# Patient Record
Sex: Female | Born: 1995 | Hispanic: Yes | Marital: Married | Smoking: Never smoker
Health system: Southern US, Community
[De-identification: ages and names within clinical notes are randomized; demographics above are authoritative.]

## PROBLEM LIST (undated history)

## (undated) DIAGNOSIS — Z789 Other specified health status: Secondary | ICD-10-CM

## (undated) HISTORY — PX: NO PAST SURGERIES: SHX2092

---
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
## 2013-04-05 NOTE — L&D Delivery Note (Signed)
`````  Attestation of Attending Supervision of Advanced Practitioner: Evaluation and management procedures were performed by the PA/NP/CNM/OB Fellow under my supervision/collaboration. Chart reviewed and agree with management and plan.  Paizlee Kinder V 12/28/2013 8:30 AM

## 2013-04-05 NOTE — L&D Delivery Note (Signed)
Delivery Note At 5:14 AM a viable female was delivered via Vaginal, Spontaneous Delivery (Presentation: ROA  ).  APGAR: 9, 9; weight 7 lb 7.8 oz (3395 g) Placenta status: intact, 3 vessel Cord:  with the following complications: .  Cord pH: n/a  Anesthesia:  none Episiotomy: n/a Lacerations: 1st degree hemostatic periurethral Suture Repair: none required Est. Blood Loss ( ): received cytotec  Mom to postpartum.  Baby to Couplet care / Skin to Skin.  Sandy Morton ROCIO 12/13/2013, 5:30 AM

## 2013-06-11 ENCOUNTER — Ambulatory Visit (INDEPENDENT_AMBULATORY_CARE_PROVIDER_SITE_OTHER): Payer: Medicaid Other | Admitting: Adult Health

## 2013-06-11 ENCOUNTER — Encounter: Payer: Self-pay | Admitting: Adult Health

## 2013-06-11 VITALS — BP 140/80 | Ht 61.0 in | Wt 156.0 lb

## 2013-06-11 DIAGNOSIS — Z3201 Encounter for pregnancy test, result positive: Secondary | ICD-10-CM

## 2013-06-11 LAB — POCT URINE PREGNANCY: Preg Test, Ur: POSITIVE

## 2013-06-11 NOTE — Progress Notes (Signed)
Patient ID: Sandy Morton, female   DOB: 09/26/1995, 18 y.o.   MRN: [id number] Pt here for pregnancy test, resulted positive. PNV given. Pt to return in 1-2 weeks for New OB.

## 2013-06-15 ENCOUNTER — Ambulatory Visit: Payer: Medicaid Other | Admitting: General Practice

## 2013-06-15 ENCOUNTER — Ambulatory Visit (INDEPENDENT_AMBULATORY_CARE_PROVIDER_SITE_OTHER): Payer: Medicaid Other | Admitting: General Practice

## 2013-06-15 ENCOUNTER — Encounter: Payer: Self-pay | Admitting: General Practice

## 2013-06-15 VITALS — BP 106/63 | HR 78 | Temp 99.8°F | Ht 59.75 in | Wt 158.0 lb

## 2013-06-15 DIAGNOSIS — L83 Acanthosis nigricans: Secondary | ICD-10-CM

## 2013-06-15 DIAGNOSIS — L089 Local infection of the skin and subcutaneous tissue, unspecified: Secondary | ICD-10-CM

## 2013-06-15 DIAGNOSIS — L259 Unspecified contact dermatitis, unspecified cause: Secondary | ICD-10-CM

## 2013-06-15 LAB — POCT GLYCOSYLATED HEMOGLOBIN (HGB A1C)

## 2013-06-15 MED ORDER — MUPIROCIN CALCIUM 2 % EX CREA: 1.0000 "application " | TOPICAL_CREAM | Freq: Three times a day (TID) | CUTANEOUS | Status: AC

## 2013-06-15 MED ORDER — TRIAMCINOLONE ACETONIDE 0.025 % EX CREA: 1.0000 "application " | TOPICAL_CREAM | Freq: Two times a day (BID) | CUTANEOUS | Status: DC

## 2013-06-15 NOTE — Patient Instructions (Signed)
Hand Washing Staying healthy is important to you and your entire family. Follow these easy, low-cost steps to help stop many infectious diseases before they happen. HOW TO [facility]  Wet your hands and apply liquid, bar, or powder soap.  Rub hands together vigorously to make a lather and scrub all surfaces. Be sure to clean between the fingers and around the nails.  Continue for 20 seconds! It takes that long for the soap and scrubbing action to dislodge and remove stubborn germs.  Rinse hands well under running water.  Dry your hands using a paper towel or air dryer.  If possible, use your paper towel or elbow to turn off the faucet. This will help avoid re-exposure to germs on the handle. WHEN TO [facility] YOUR HANDS  Before and after eating.  Before, during, and after handling or preparing food.  After contact with blood or body fluids (like vomit, nasal secretions, or saliva). This means washing after you blow your nose!  Before and after changing a diaper.  After you use the bathroom.  After handling animals, their toys, leashes, or waste.  After touching something that could be contaminated (such as a trash can, cleaning cloth, drain, or soil).  Before and after taking care of (dressing) a wound, giving medicine, or inserting contact lenses.  More often when someone in your home is sick.  Whenever your hands become soiled. If soap and water are not available, use an alcohol-based wipe or hand gel. Keeping your hands clean is one of the best ways to keep from getting sick and spreading illnesses. Cleaning your hands gets rid of germs you pick up:  From other people.  From the surfaces you touch.  From the animals you come in contact with. Document Released: 11/10/2004 Document Revised: 07/17/2012 Document Reviewed: 04/17/2008 [facility] Patient Information 2014 [place], [place].  Lavado de manos Careers information officer) Mantener la salud es importante tanto para usted como para toda su  familia. Siga estos simples y econmicos consejos para ayudar a prevenir muchas enfermedades infecciosas. CMO LAVARSE  Moje sus manos y aplique jabn lquido, en barra o en polvo.  Frtese las [employer] s vigorosamente para que se forme espuma, y refriegue todas las superficies. Asegrese de [employer] dedos y alrededor de las uas.  Contine durante 20 segundos! Se necesita esa cantidad de tiempo para que el jabn y la accin de frotacin logren desprender y remover algunos grmenes "testarudos".  Enjuguese bien las manos bajo el agua.  Squelas utilizando toallas de papel o Neomia Dear secadora de [place].  De ser posible, utilice la toalla de papel para cerrar el grifo. CUNDO LAVARSE LAS MANOS  Antes de comer.  Antes, durante, y luego de Community education officer o preparar alimentos.  Luego de entrar en contacto con sangre o fludos corporales (como vmito, secreciones nasales, o saliva). Esto significa que debe lavarse despus de limpiarse la nariz.  Luego de cambiar paales.  Luego de [employer].  Luego de tocar animales o sus juguetes, correa o deshechos.  Luego de tocar algo que pudo estar contaminado (como el bote de basura, pao de limpieza, el desage o [place]).  Antes de colocar un vendaje, dar medicamentos o colocar lentes de contacto.  Hgalo ms a menudo cuando haya alguien enfermo en su casa.  Siempre que sus manos se vean sucias. Si no tiene [place] y [place], use un limpiador de manos con alcohol. Mantener las manos limpias es una de las mejores formas de Automotive engineer enfermarse y Engineering geologist  enfermedades. Limpiando sus manos eliminar los grmenes que obtuvo:  FirefighterDe otras personas.  De las superficies que toca.  De los VF Corporationanimales con los que TEFL teacherentra en contacto. Document Released: 09/08/2007 Document Revised: 06/14/2011 [facility] Patient Information 2014 [place], [place].

## 2013-06-15 NOTE — Progress Notes (Signed)
   Subjective:    Patient ID: Sandy Morton, female    DOB: 03/17/1996, 18 y.o.   MRN: [id number]  HPI Patient presents today with complaints of red bump to left forearm. Reports similar symptoms in the past, which have healed. Denies itching, but tender at times. She is pregnant, but unsure of gestational age. [facility] and has an appointment in two weeks.  Denies any other problems.     Review of Systems  Constitutional: Negative for fever and chills.  Respiratory: Negative for chest tightness and shortness of breath.   Cardiovascular: Negative for chest pain and palpitations.  Skin:       Red area to left forearm       Objective:   Physical Exam  Constitutional: She is oriented to person, place, and time. She appears well-developed and well-nourished.  Cardiovascular: Normal rate, regular rhythm and normal heart sounds.   Pulmonary/Chest: Effort normal and breath sounds normal. No respiratory distress. She exhibits no tenderness.  Neurological: She is alert and oriented to person, place, and time.  Skin: Skin is warm and dry. There is erythema.  Raised, firm, erythematous area to left forearm.   Maculopapular rash near bilateral underarm  Psychiatric: She has a normal mood and affect.          Assessment & Plan:  1. AN (acanthosis nigricans)  - POCT glycosylated hemoglobin (Hb A1C)  2. Skin infection  - Aerobic culture - mupirocin cream (BACTROBAN) 2 %; Apply 1 application topically 3 (three) times daily. Apply to affected area  Dispense: 15 g; Refill: 1  3. Contact dermatitis  - triamcinolone (KENALOG) 0.025 % cream; Apply 1 application topically 2 (two) times daily. Apply to rash close to under arms  Dispense: 30 g; Refill: 0 -keep skin clean and dry -avoid scratching -RTO prn -will refer to dermatology if symptoms persist Patient verbalized understanding Coralie KeensMae E. Darria Corvera, FNP-C

## 2013-06-17 LAB — AEROBIC CULTURE

## 2013-06-18 ENCOUNTER — Other Ambulatory Visit: Payer: Self-pay | Admitting: Obstetrics & Gynecology

## 2013-06-18 DIAGNOSIS — O3680X0 Pregnancy with inconclusive fetal viability, not applicable or unspecified: Secondary | ICD-10-CM

## 2013-06-20 ENCOUNTER — Other Ambulatory Visit: Payer: Medicaid Other

## 2013-06-23 ENCOUNTER — Other Ambulatory Visit: Payer: Self-pay | Admitting: General Practice

## 2013-06-23 DIAGNOSIS — L089 Local infection of the skin and subcutaneous tissue, unspecified: Secondary | ICD-10-CM

## 2013-06-23 DIAGNOSIS — B958 Unspecified staphylococcus as the cause of diseases classified elsewhere: Secondary | ICD-10-CM

## 2013-06-23 DIAGNOSIS — R21 Rash and other nonspecific skin eruption: Secondary | ICD-10-CM

## 2013-07-02 ENCOUNTER — Other Ambulatory Visit: Payer: Self-pay | Admitting: Obstetrics & Gynecology

## 2013-07-02 ENCOUNTER — Ambulatory Visit (INDEPENDENT_AMBULATORY_CARE_PROVIDER_SITE_OTHER): Payer: Medicaid Other

## 2013-07-02 DIAGNOSIS — Z1389 Encounter for screening for other disorder: Secondary | ICD-10-CM

## 2013-07-02 DIAGNOSIS — O0930 Supervision of pregnancy with insufficient antenatal care, unspecified trimester: Secondary | ICD-10-CM

## 2013-07-02 DIAGNOSIS — Z363 Encounter for antenatal screening for malformations: Secondary | ICD-10-CM

## 2013-07-02 DIAGNOSIS — O3680X0 Pregnancy with inconclusive fetal viability, not applicable or unspecified: Secondary | ICD-10-CM

## 2013-07-02 NOTE — Progress Notes (Signed)
U/S-single active fetus, meas c/w 16+4wks EDD 12/13/2013, cx appears closed (3.5cm), bilateral adnexa appears WNL, anterior Gr 0 placenta, FHR-157 BPM, anatomy reviewed unable to get transverse spine pics and 3VC, would like to reck all anatomy at ~24-28 wks, female fetus

## 2013-07-04 ENCOUNTER — Encounter: Payer: Self-pay | Admitting: *Deleted

## 2013-07-10 ENCOUNTER — Encounter: Payer: Medicaid Other | Admitting: Women's Health

## 2013-09-04 ENCOUNTER — Encounter: Payer: Self-pay | Admitting: Adult Health

## 2013-09-04 ENCOUNTER — Ambulatory Visit (INDEPENDENT_AMBULATORY_CARE_PROVIDER_SITE_OTHER): Payer: Medicaid Other | Admitting: Adult Health

## 2013-09-04 VITALS — BP 118/64 | Wt 166.0 lb

## 2013-09-04 DIAGNOSIS — Z3490 Encounter for supervision of normal pregnancy, unspecified, unspecified trimester: Secondary | ICD-10-CM | POA: Insufficient documentation

## 2013-09-04 DIAGNOSIS — Z3480 Encounter for supervision of other normal pregnancy, unspecified trimester: Secondary | ICD-10-CM

## 2013-09-04 NOTE — Patient Instructions (Signed)
Third Trimester of Pregnancy The third trimester is from week 29 through week 42, months 7 through 9. The third trimester is a time when the fetus is growing rapidly. At the end of the ninth month, the fetus is about 20 inches in length and weighs 6 10 pounds.  BODY CHANGES Your body goes through many changes during pregnancy. The changes vary from woman to woman.   Your weight will continue to increase. You can expect to gain 25 35 pounds (11 16 kg) by the end of the pregnancy.  You may begin to get stretch marks on your hips, abdomen, and breasts.  You may urinate more often because the fetus is moving lower into your pelvis and pressing on your bladder.  You may develop or continue to have heartburn as a result of your pregnancy.  You may develop constipation because certain hormones are causing the muscles that push waste through your intestines to slow down.  You may develop hemorrhoids or swollen, bulging veins (varicose veins).  You may have pelvic pain because of the weight gain and pregnancy hormones relaxing your joints between the bones in your pelvis. Back aches may result from over exertion of the muscles supporting your posture.  Your breasts will continue to grow and be tender. A yellow discharge may leak from your breasts called colostrum.  Your belly button may stick out.  You may feel short of breath because of your expanding uterus.  You may notice the fetus "dropping," or moving lower in your abdomen.  You may have a bloody mucus discharge. This usually occurs a few days to a week before labor begins.  Your cervix becomes thin and soft (effaced) near your due date. WHAT TO EXPECT AT YOUR PRENATAL EXAMS  You will have prenatal exams every 2 weeks until week 36. Then, you will have weekly prenatal exams. During a routine prenatal visit:  You will be weighed to make sure you and the fetus are growing normally.  Your blood pressure is taken.  Your abdomen will be  measured to track your baby's growth.  The fetal heartbeat will be listened to.  Any test results from the previous visit will be discussed.  You may have a cervical check near your due date to see if you have effaced. At around 36 weeks, your caregiver will check your cervix. At the same time, your caregiver will also perform a test on the secretions of the vaginal tissue. This test is to determine if a type of bacteria, Group B streptococcus, is present. Your caregiver will explain this further. Your caregiver may ask you:  What your birth plan is.  How you are feeling.  If you are feeling the baby move.  If you have had any abnormal symptoms, such as leaking fluid, bleeding, severe headaches, or abdominal cramping.  If you have any questions. Other tests or screenings that may be performed during your third trimester include:  Blood tests that check for low iron levels (anemia).  Fetal testing to check the health, activity level, and growth of the fetus. Testing is done if you have certain medical conditions or if there are problems during the pregnancy. FALSE LABOR You may feel small, irregular contractions that eventually go away. These are called Braxton Hicks contractions, or false labor. Contractions may last for hours, days, or even weeks before true labor sets in. If contractions come at regular intervals, intensify, or become painful, it is best to be seen by your caregiver.  SIGNS OF LABOR   Menstrual-like cramps.  Contractions that are 5 minutes apart or less.  Contractions that start on the top of the uterus and spread down to the lower abdomen and back.  A sense of increased pelvic pressure or back pain.  A watery or bloody mucus discharge that comes from the vagina. If you have any of these signs before the 37th week of pregnancy, call your caregiver right away. You need to go to the hospital to get checked immediately. HOME CARE INSTRUCTIONS   Avoid all  smoking, herbs, alcohol, and unprescribed drugs. These chemicals affect the formation and growth of the baby.  Follow your caregiver's instructions regarding medicine use. There are medicines that are either safe or unsafe to take during pregnancy.  Exercise only as directed by your caregiver. Experiencing uterine cramps is a good sign to stop exercising.  Continue to eat regular, healthy meals.  Wear a good support bra for breast tenderness.  Do not use hot tubs, steam rooms, or saunas.  Wear your seat belt at all times when driving.  Avoid raw meat, uncooked cheese, cat litter boxes, and soil used by cats. These carry germs that can cause birth defects in the baby.  Take your prenatal vitamins.  Try taking a stool softener (if your caregiver approves) if you develop constipation. Eat more high-fiber foods, such as fresh vegetables or fruit and whole grains. Drink plenty of fluids to keep your urine clear or pale yellow.  Take warm sitz baths to soothe any pain or discomfort caused by hemorrhoids. Use hemorrhoid cream if your caregiver approves.  If you develop varicose veins, wear support hose. Elevate your feet for 15 minutes, 3 4 times a day. Limit salt in your diet.  Avoid heavy lifting, wear low heal shoes, and practice good posture.  Rest a lot with your legs elevated if you have leg cramps or low back pain.  Visit your dentist if you have not gone during your pregnancy. Use a soft toothbrush to brush your teeth and be gentle when you floss.  A sexual relationship may be continued unless your caregiver directs you otherwise.  Do not travel far distances unless it is absolutely necessary and only with the approval of your caregiver.  Take prenatal classes to understand, practice, and ask questions about the labor and delivery.  Make a trial run to the hospital.  Pack your hospital bag.  Prepare the baby's nursery.  Continue to go to all your prenatal visits as directed  by your caregiver. SEEK MEDICAL CARE IF:  You are unsure if you are in labor or if your water has broken.  You have dizziness.  You have mild pelvic cramps, pelvic pressure, or nagging pain in your abdominal area.  You have persistent nausea, vomiting, or diarrhea.  You have a bad smelling vaginal discharge.  You have pain with urination. SEEK IMMEDIATE MEDICAL CARE IF:   You have a fever.  You are leaking fluid from your vagina.  You have spotting or bleeding from your vagina.  You have severe abdominal cramping or pain.  You have rapid weight loss or gain.  You have shortness of breath with chest pain.  You notice sudden or extreme swelling of your face, hands, ankles, feet, or legs.  You have not felt your baby move in over an hour.  You have severe headaches that do not go away with medicine.  You have vision changes. Document Released: 03/16/2001 Document Revised: 11/22/2012 Document Reviewed:   05/23/2012 Exit[employer] Patient Information 2014 [place], [place]. Return in 3 weeks for [place] and 2 hour sugar test

## 2013-09-04 NOTE — Progress Notes (Signed)
Subjective:  Sandy Morton is a 18 y.o. G78P1001 Hispanic female at [redacted]w[redacted]d by LMP being seen today for her first obstetrical visit.  Her obstetrical history is significant for pre-eclampsia was induced with prior delivery.  Pregnancy history fully reviewed.  Patient reports no complaints. Denies vb, cramping, uti s/s, abnormal/malodorous vag d/c, or vulvovaginal itching/irritation.  BP 118/64  Wt 166 lb (75.297 kg)  LMP 02/28/2013  HISTORY: OB History  Gravida Para Term Preterm AB SAB TAB Ectopic Multiple Living  2 1 1  0 0 0 0 0 0 1    # Outcome Date GA Lbr Len/2nd Weight Sex Delivery Anes PTL Lv  2 CUR           1 TRM 06/20/11 [redacted]w[redacted]d 06:00 / 02:16 7 lb 4.2 oz (3.294 kg) M SVD EPI  Y     Past Medical History  Diagnosis Date  . No pertinent past medical history   . Pregnant state, incidental   . Supervision of normal pregnancy 09/04/2013   Past Surgical History  Procedure Laterality Date  . No past surgeries     History reviewed. No pertinent family history.  Exam   System:     General: Well developed & nourished, no acute distress   Skin: Warm & dry, normal coloration and turgor, no rashes   Neurologic: Alert & oriented, normal mood   Cardiovascular: Regular rate & rhythm   Respiratory: Effort & rate normal, LCTAB, acyanotic   Abdomen: Soft, non tender   Extremities: normal strength, tone   Pelvic Exam:    Perineum: deferred   Vulva: deferred   Vagina:  deferred   Cervix: deferred   Uterus: deferred    FHR:136  Via doppler   Assessment:   Pregnancy: G2P1001 Patient Active Problem List   Diagnosis Date Noted  . Supervision of normal pregnancy 09/04/2013  . Preeclampsia, severe 06/21/2011    [redacted]w[redacted]d G2P1001 New OB visit     Plan:  Initial labs drawn Continue prenatal vitamins Problem list reviewed and updated Reviewed n/v relief measures and warning s/s to report Reviewed recommended weight gain based on pre-gravid BMI Encouraged well-balanced  diet Genetic Screening discussed Quad Screen: too late Cystic fibrosis screening discussed requested Ultrasound discussed; fetal survey: requested Follow up in 3 weeks for [place] and 2 hour GTT and HSV 2 Reviewed BP,weight and urine.  Adline Potter, NP 09/04/2013 10:14 AM

## 2013-09-04 NOTE — Progress Notes (Signed)
Pt here for New OB visit. Pt given CCNC form and lab consent to read over and sign. Pt denies any problems or concerns at this time.

## 2013-09-05 ENCOUNTER — Encounter: Payer: Self-pay | Admitting: Adult Health

## 2013-09-05 LAB — DRUG SCREEN, URINE, NO CONFIRMATION
AMPHETAMINE SCRN UR: NEGATIVE
Barbiturate Quant, Ur: NEGATIVE
Benzodiazepines.: NEGATIVE
Cocaine Metabolites: NEGATIVE
Creatinine,U: 130 mg/dL
MARIJUANA METABOLITE: NEGATIVE
METHADONE: NEGATIVE
OPIATE SCREEN, URINE: NEGATIVE
PROPOXYPHENE: NEGATIVE
Phencyclidine (PCP): NEGATIVE

## 2013-09-05 LAB — CBC
HEMATOCRIT: 37.4 % (ref 36.0–46.0)
HEMOGLOBIN: 13.2 g/dL (ref 12.0–15.0)
MCH: 32.6 pg (ref 26.0–34.0)
MCHC: 35.3 g/dL (ref 30.0–36.0)
MCV: 92.3 fL (ref 78.0–100.0)
Platelets: 176 10*3/uL (ref 150–400)
RBC: 4.05 MIL/uL (ref 3.87–5.11)
RDW: 13.4 % (ref 11.5–15.5)
WBC: 7.8 10*3/uL (ref 4.0–10.5)

## 2013-09-05 LAB — GC/CHLAMYDIA PROBE AMP
CT Probe RNA: NEGATIVE
GC Probe RNA: NEGATIVE

## 2013-09-05 LAB — TSH: TSH: 0.832 u[IU]/mL (ref 0.350–4.500)

## 2013-09-05 LAB — URINALYSIS
Bilirubin Urine: NEGATIVE
GLUCOSE, UA: NEGATIVE mg/dL
Hgb urine dipstick: NEGATIVE
Ketones, ur: NEGATIVE mg/dL
LEUKOCYTES UA: NEGATIVE
NITRITE: NEGATIVE
PH: 7 (ref 5.0–8.0)
Protein, ur: NEGATIVE mg/dL
SPECIFIC GRAVITY, URINE: 1.01 (ref 1.005–1.030)
Urobilinogen, UA: 0.2 mg/dL (ref 0.0–1.0)

## 2013-09-05 LAB — CYSTIC FIBROSIS DIAGNOSTIC STUDY

## 2013-09-05 LAB — RPR

## 2013-09-05 LAB — VARICELLA ZOSTER ANTIBODY, IGG: VARICELLA IGG: 347.9 {index} — AB (ref <135.00)

## 2013-09-05 LAB — HEPATITIS B SURFACE ANTIGEN: HEP B S AG: NEGATIVE

## 2013-09-05 LAB — RUBELLA SCREEN: Rubella: 0.47 Index (ref <0.90)

## 2013-09-05 LAB — HIV ANTIBODY (ROUTINE TESTING W REFLEX): HIV: NONREACTIVE

## 2013-09-05 LAB — ANTIBODY SCREEN: ANTIBODY SCREEN: NEGATIVE

## 2013-09-05 LAB — OXYCODONE SCREEN, UA, RFLX CONFIRM: OXYCODONE SCRN UR: NEGATIVE ng/mL

## 2013-09-05 LAB — ABO AND RH: Rh Type: POSITIVE

## 2013-09-06 ENCOUNTER — Encounter: Payer: Self-pay | Admitting: Adult Health

## 2013-09-06 LAB — URINE CULTURE
COLONY COUNT: NO GROWTH
Organism ID, Bacteria: NO GROWTH

## 2013-09-19 ENCOUNTER — Other Ambulatory Visit: Payer: Self-pay | Admitting: Adult Health

## 2013-09-19 DIAGNOSIS — O0930 Supervision of pregnancy with insufficient antenatal care, unspecified trimester: Secondary | ICD-10-CM

## 2013-09-19 DIAGNOSIS — O09299 Supervision of pregnancy with other poor reproductive or obstetric history, unspecified trimester: Secondary | ICD-10-CM

## 2013-09-25 ENCOUNTER — Ambulatory Visit (INDEPENDENT_AMBULATORY_CARE_PROVIDER_SITE_OTHER): Payer: Medicaid Other | Admitting: Women's Health

## 2013-09-25 ENCOUNTER — Encounter: Payer: Self-pay | Admitting: Women's Health

## 2013-09-25 ENCOUNTER — Ambulatory Visit (INDEPENDENT_AMBULATORY_CARE_PROVIDER_SITE_OTHER): Payer: Medicaid Other

## 2013-09-25 ENCOUNTER — Other Ambulatory Visit: Payer: Medicaid Other

## 2013-09-25 VITALS — BP 122/64 | Wt 165.0 lb

## 2013-09-25 DIAGNOSIS — Z3480 Encounter for supervision of other normal pregnancy, unspecified trimester: Secondary | ICD-10-CM

## 2013-09-25 DIAGNOSIS — O0930 Supervision of pregnancy with insufficient antenatal care, unspecified trimester: Secondary | ICD-10-CM

## 2013-09-25 DIAGNOSIS — Z3482 Encounter for supervision of other normal pregnancy, second trimester: Secondary | ICD-10-CM

## 2013-09-25 DIAGNOSIS — O1413 Severe pre-eclampsia, third trimester: Secondary | ICD-10-CM

## 2013-09-25 DIAGNOSIS — O09299 Supervision of pregnancy with other poor reproductive or obstetric history, unspecified trimester: Secondary | ICD-10-CM

## 2013-09-25 DIAGNOSIS — Z331 Pregnant state, incidental: Secondary | ICD-10-CM

## 2013-09-25 DIAGNOSIS — Z3493 Encounter for supervision of normal pregnancy, unspecified, third trimester: Secondary | ICD-10-CM

## 2013-09-25 DIAGNOSIS — O09293 Supervision of pregnancy with other poor reproductive or obstetric history, third trimester: Secondary | ICD-10-CM

## 2013-09-25 DIAGNOSIS — Z1389 Encounter for screening for other disorder: Secondary | ICD-10-CM

## 2013-09-25 LAB — CBC
HEMATOCRIT: 36.3 % (ref 36.0–46.0)
Hemoglobin: 12.8 g/dL (ref 12.0–15.0)
MCH: 32.2 pg (ref 26.0–34.0)
MCHC: 35.3 g/dL (ref 30.0–36.0)
MCV: 91.2 fL (ref 78.0–100.0)
PLATELETS: 148 10*3/uL — AB (ref 150–400)
RBC: 3.98 MIL/uL (ref 3.87–5.11)
RDW: 13.4 % (ref 11.5–15.5)
WBC: 7 10*3/uL (ref 4.0–10.5)

## 2013-09-25 LAB — POCT URINALYSIS DIPSTICK
Blood, UA: NEGATIVE
GLUCOSE UA: NEGATIVE
Ketones, UA: NEGATIVE
NITRITE UA: NEGATIVE
Protein, UA: NEGATIVE

## 2013-09-25 NOTE — Progress Notes (Signed)
U/S(28+5wks)-vtx active fetus, approp growth EFW 2 lb 9 oz (41st%tile), fluid WNL SDP-4+cm, anterior Gr 1 placenta, cx appears closed, bilateral adnexa appears WNL, female fetus, anatomy reviewed, FHR_ 147 bpm

## 2013-09-25 NOTE — Patient Instructions (Signed)
81mg  Baby Aspirin daily Tdap vaccine at 28 weeks at health department or your family doctor, recommended for you and anyone who will be around the baby a lot   Third Trimester of Pregnancy The third trimester is from week 29 through week 42, months 7 through 9. The third trimester is a time when the fetus is growing rapidly. At the end of the ninth month, the fetus is about 20 inches in length and weighs 6-10 pounds.  BODY CHANGES Your body goes through many changes during pregnancy. The changes vary from woman to woman.   Your weight will continue to increase. You can expect to gain 25-35 pounds (11-16 kg) by the end of the pregnancy.  You may begin to get stretch marks on your hips, abdomen, and breasts.  You may urinate more often because the fetus is moving lower into your pelvis and pressing on your bladder.  You may develop or continue to have heartburn as a result of your pregnancy.  You may develop constipation because certain hormones are causing the muscles that push waste through your intestines to slow down.  You may develop hemorrhoids or swollen, bulging veins (varicose veins).  You may have pelvic pain because of the weight gain and pregnancy hormones relaxing your joints between the bones in your pelvis. Backaches may result from overexertion of the muscles supporting your posture.  You may have changes in your hair. These can include thickening of your hair, rapid growth, and changes in texture. Some women also have hair loss during or after pregnancy, or hair that feels dry or thin. Your hair will most likely return to normal after your baby is born.  Your breasts will continue to grow and be tender. A yellow discharge may leak from your breasts called colostrum.  Your belly button may stick out.  You may feel short of breath because of your expanding uterus.  You may notice the fetus "dropping," or moving lower in your abdomen.  You may have a bloody mucus  discharge. This usually occurs a few days to a week before labor begins.  Your cervix becomes thin and soft (effaced) near your due date. WHAT TO EXPECT AT YOUR PRENATAL EXAMS  You will have prenatal exams every 2 weeks until week 36. Then, you will have weekly prenatal exams. During a routine prenatal visit:  You will be weighed to make sure you and the fetus are growing normally.  Your blood pressure is taken.  Your abdomen will be measured to track your baby's growth.  The fetal heartbeat will be listened to.  Any test results from the previous visit will be discussed.  You may have a cervical check near your due date to see if you have effaced. At around 36 weeks, your caregiver will check your cervix. At the same time, your caregiver will also perform a test on the secretions of the vaginal tissue. This test is to determine if a type of bacteria, Group B streptococcus, is present. Your caregiver will explain this further. Your caregiver may ask you:  What your birth plan is.  How you are feeling.  If you are feeling the baby move.  If you have had any abnormal symptoms, such as leaking fluid, bleeding, severe headaches, or abdominal cramping.  If you have any questions. Other tests or screenings that may be performed during your third trimester include:  Blood tests that check for low iron levels (anemia).  Fetal testing to check the health, activity level,  and growth of the fetus. Testing is done if you have certain medical conditions or if there are problems during the pregnancy. FALSE LABOR You may feel small, irregular contractions that eventually go away. These are called Braxton Hicks contractions, or false labor. Contractions may last for hours, days, or even weeks before true labor sets in. If contractions come at regular intervals, intensify, or become painful, it is best to be seen by your caregiver.  SIGNS OF LABOR   Menstrual-like cramps.  Contractions that  are 5 minutes apart or less.  Contractions that start on the top of the uterus and spread down to the lower abdomen and back.  A sense of increased pelvic pressure or back pain.  A watery or bloody mucus discharge that comes from the vagina. If you have any of these signs before the 37th week of pregnancy, call your caregiver right away. You need to go to the hospital to get checked immediately. HOME CARE INSTRUCTIONS   Avoid all smoking, herbs, alcohol, and unprescribed drugs. These chemicals affect the formation and growth of the baby.  Follow your caregiver's instructions regarding medicine use. There are medicines that are either safe or unsafe to take during pregnancy.  Exercise only as directed by your caregiver. Experiencing uterine cramps is a good sign to stop exercising.  Continue to eat regular, healthy meals.  Wear a good support bra for breast tenderness.  Do not use hot tubs, steam rooms, or saunas.  Wear your seat belt at all times when driving.  Avoid raw meat, uncooked cheese, cat litter boxes, and soil used by cats. These carry germs that can cause birth defects in the baby.  Take your prenatal vitamins.  Try taking a stool softener (if your caregiver approves) if you develop constipation. Eat more high-fiber foods, such as fresh vegetables or fruit and whole grains. Drink plenty of fluids to keep your urine clear or pale yellow.  Take warm sitz baths to soothe any pain or discomfort caused by hemorrhoids. Use hemorrhoid cream if your caregiver approves.  If you develop varicose veins, wear support hose. Elevate your feet for 15 minutes, 3-4 times a day. Limit salt in your diet.  Avoid heavy lifting, wear low heal shoes, and practice good posture.  Rest a lot with your legs elevated if you have leg cramps or low back pain.  Visit your dentist if you have not gone during your pregnancy. Use a soft toothbrush to brush your teeth and be gentle when you floss.  A  sexual relationship may be continued unless your caregiver directs you otherwise.  Do not travel far distances unless it is absolutely necessary and only with the approval of your caregiver.  Take prenatal classes to understand, practice, and ask questions about the labor and delivery.  Make a trial run to the hospital.  Pack your hospital bag.  Prepare the baby's nursery.  Continue to go to all your prenatal visits as directed by your caregiver. SEEK MEDICAL CARE IF:  You are unsure if you are in labor or if your water has broken.  You have dizziness.  You have mild pelvic cramps, pelvic pressure, or nagging pain in your abdominal area.  You have persistent nausea, vomiting, or diarrhea.  You have a bad smelling vaginal discharge.  You have pain with urination. SEEK IMMEDIATE MEDICAL CARE IF:   You have a fever.  You are leaking fluid from your vagina.  You have spotting or bleeding from your vagina.  You have severe abdominal cramping or pain.  You have rapid weight loss or gain.  You have shortness of breath with chest pain.  You notice sudden or extreme swelling of your face, hands, ankles, feet, or legs.  You have not felt your baby move in over an hour.  You have severe headaches that do not go away with medicine.  You have vision changes. Document Released: 03/16/2001 Document Revised: 03/27/2013 Document Reviewed: 05/23/2012 [employer] Patient Information 2015 [place], [place]. This information is not intended to replace advice given to you by your health care provider. Make sure you discuss any questions you have with your health care provider.

## 2013-09-25 NOTE — Progress Notes (Signed)
Low-risk OB appointment G2P1001 [id number] Estimated Date of Delivery: 12/13/13 Blood pressure 122/64, weight 165 lb (74.844 kg), last menstrual period 02/28/2013.  BP, weight, and urine reviewed.  Refer to obstetrical flow sheet for FH & FHR.  Reports good fm.  Denies regular uc's, lof, vb, or uti s/s. No complaints. H/o severe pre-e w/ IOL last pregnancy, to begin baby asa daily Reviewed ptl s/s, fkc. Plan:  Continue routine obstetrical care, begin daily baby asa F/U in 4wks for OB appointment  PN2 today

## 2013-09-26 ENCOUNTER — Encounter: Payer: Self-pay | Admitting: Women's Health

## 2013-09-26 LAB — ANTIBODY SCREEN: Antibody Screen: NEGATIVE

## 2013-09-26 LAB — RPR

## 2013-09-26 LAB — HSV 2 ANTIBODY, IGG

## 2013-09-26 LAB — GLUCOSE TOLERANCE, 2 HOURS W/ 1HR
GLUCOSE, FASTING: 79 mg/dL (ref 70–99)
Glucose, 1 hour: 163 mg/dL (ref 70–170)
Glucose, 2 hour: 138 mg/dL (ref 70–139)

## 2013-09-26 LAB — HIV ANTIBODY (ROUTINE TESTING W REFLEX): HIV 1&2 Ab, 4th Generation: NONREACTIVE

## 2013-10-23 ENCOUNTER — Encounter: Payer: Self-pay | Admitting: Women's Health

## 2013-11-06 ENCOUNTER — Encounter: Payer: Self-pay | Admitting: Women's Health

## 2013-11-06 ENCOUNTER — Ambulatory Visit (INDEPENDENT_AMBULATORY_CARE_PROVIDER_SITE_OTHER): Payer: Medicaid Other | Admitting: Women's Health

## 2013-11-06 VITALS — BP 126/74 | Wt 168.6 lb

## 2013-11-06 DIAGNOSIS — Z331 Pregnant state, incidental: Secondary | ICD-10-CM

## 2013-11-06 DIAGNOSIS — Z1389 Encounter for screening for other disorder: Secondary | ICD-10-CM

## 2013-11-06 DIAGNOSIS — Z3493 Encounter for supervision of normal pregnancy, unspecified, third trimester: Secondary | ICD-10-CM

## 2013-11-06 DIAGNOSIS — Z3480 Encounter for supervision of other normal pregnancy, unspecified trimester: Secondary | ICD-10-CM

## 2013-11-06 LAB — POCT URINALYSIS DIPSTICK
Blood, UA: NEGATIVE
Glucose, UA: NEGATIVE
KETONES UA: NEGATIVE
Nitrite, UA: NEGATIVE
PROTEIN UA: NEGATIVE

## 2013-11-06 NOTE — Progress Notes (Signed)
Low-risk OB appointment G2P1001 3857w5d Estimated Date of Delivery: 12/13/13 BP 126/74  Wt 168 lb 9.6 oz (76.476 kg)  LMP 02/28/2013  BP, weight, and urine reviewed.  Refer to obstetrical flow sheet for FH & FHR.  Reports good fm.  Denies regular uc's, lof, vb, or uti s/s. No complaints. Missed visit 2wks ago, forgot. Reviewed pn2 results, ptl s/s, fkc. Plan:  Continue routine obstetrical care  F/U in 2wks  for OB appointment

## 2013-11-06 NOTE — Patient Instructions (Signed)

## 2013-11-20 ENCOUNTER — Encounter: Payer: Medicaid Other | Admitting: Women's Health

## 2013-11-27 ENCOUNTER — Ambulatory Visit (INDEPENDENT_AMBULATORY_CARE_PROVIDER_SITE_OTHER): Payer: Medicaid Other | Admitting: Obstetrics & Gynecology

## 2013-11-27 ENCOUNTER — Encounter: Payer: Self-pay | Admitting: Obstetrics & Gynecology

## 2013-11-27 VITALS — BP 120/82 | Wt 180.0 lb

## 2013-11-27 DIAGNOSIS — Z1389 Encounter for screening for other disorder: Secondary | ICD-10-CM

## 2013-11-27 DIAGNOSIS — Z3483 Encounter for supervision of other normal pregnancy, third trimester: Secondary | ICD-10-CM

## 2013-11-27 DIAGNOSIS — Z3480 Encounter for supervision of other normal pregnancy, unspecified trimester: Secondary | ICD-10-CM

## 2013-11-27 DIAGNOSIS — Z3685 Encounter for antenatal screening for Streptococcus B: Secondary | ICD-10-CM

## 2013-11-27 DIAGNOSIS — Z1159 Encounter for screening for other viral diseases: Secondary | ICD-10-CM

## 2013-11-27 DIAGNOSIS — Z331 Pregnant state, incidental: Secondary | ICD-10-CM

## 2013-11-27 LAB — POCT URINALYSIS DIPSTICK
Blood, UA: NEGATIVE
Glucose, UA: NEGATIVE
Ketones, UA: NEGATIVE
Leukocytes, UA: NEGATIVE
NITRITE UA: NEGATIVE

## 2013-11-27 LAB — OB RESULTS CONSOLE GC/CHLAMYDIA
Chlamydia: NEGATIVE
GC PROBE AMP, GENITAL: NEGATIVE

## 2013-11-27 NOTE — Progress Notes (Signed)
G2P1001 [redacted]w[redacted]d Estimated Date of Delivery: 12/13/13  Blood pressure 120/82, weight 180 lb (81.647 kg), last menstrual period 02/28/2013.   BP weight and urine results all reviewed and noted.  Please refer to the obstetrical flow sheet for the fundal height and fetal heart rate documentation:  Patient reports good fetal movement, denies any bleeding and no rupture of membranes symptoms or regular contractions. Patient is without complaints. All questions were answered.  Plan:  Continued routine obstetrical care,   Follow up in 1 weeks for OB appointment, ob visit

## 2013-11-27 NOTE — Progress Notes (Signed)
Pt denies any problems or concerns at this time.  

## 2013-11-28 LAB — GC/CHLAMYDIA PROBE AMP
CT Probe RNA: NEGATIVE
GC Probe RNA: NEGATIVE

## 2013-11-28 LAB — STREP B DNA PROBE: GBSP: DETECTED

## 2013-12-02 ENCOUNTER — Encounter: Payer: Self-pay | Admitting: Obstetrics & Gynecology

## 2013-12-07 ENCOUNTER — Ambulatory Visit (INDEPENDENT_AMBULATORY_CARE_PROVIDER_SITE_OTHER): Payer: Medicaid Other | Admitting: Obstetrics and Gynecology

## 2013-12-07 ENCOUNTER — Encounter: Payer: Self-pay | Admitting: Obstetrics and Gynecology

## 2013-12-07 VITALS — BP 120/80 | Wt 181.0 lb

## 2013-12-07 DIAGNOSIS — Z3490 Encounter for supervision of normal pregnancy, unspecified, unspecified trimester: Secondary | ICD-10-CM | POA: Insufficient documentation

## 2013-12-07 DIAGNOSIS — Z3480 Encounter for supervision of other normal pregnancy, unspecified trimester: Secondary | ICD-10-CM

## 2013-12-07 DIAGNOSIS — O0930 Supervision of pregnancy with insufficient antenatal care, unspecified trimester: Secondary | ICD-10-CM

## 2013-12-07 DIAGNOSIS — Z331 Pregnant state, incidental: Secondary | ICD-10-CM

## 2013-12-07 DIAGNOSIS — Z3493 Encounter for supervision of normal pregnancy, unspecified, third trimester: Secondary | ICD-10-CM

## 2013-12-07 DIAGNOSIS — Z1389 Encounter for screening for other disorder: Secondary | ICD-10-CM

## 2013-12-07 LAB — POCT URINALYSIS DIPSTICK
Blood, UA: NEGATIVE
Glucose, UA: NEGATIVE
Ketones, UA: NEGATIVE
LEUKOCYTES UA: NEGATIVE
Nitrite, UA: NEGATIVE

## 2013-12-07 NOTE — Progress Notes (Signed)
G2P1001 [redacted]w[redacted]d Estimated Date of Delivery: 12/13/13  Blood pressure 120/80, weight 181 lb (82.101 kg), last menstrual period 02/28/2013.   refer to the ob flow sheet for FH and FHR, also BP, Wt, Urine results:notable for negative  Patient reports good fetal movement, denies any bleeding and no rupture of membranes symptoms or regular contractions. Patient complaints: She currently denies any problems or concerns. She states that she feels her stomach tightening every couple hours. She states that she had to be induced last time.for PIH  .  She is currently 2.5 cm dilated she is 50% effaced/ -2 sta  Questions were answered. Plan:  Continued routine obstetrical care, IOL at 41wk in undelivered  F/u in 1 weeks.

## 2013-12-13 ENCOUNTER — Inpatient Hospital Stay (HOSPITAL_COMMUNITY)
Admission: AD | Admit: 2013-12-13 | Discharge: 2013-12-15 | DRG: 775 | Disposition: A | Discharge status: Home / Self Care | Payer: Medicaid Other | Source: Ambulatory Visit | Attending: Obstetrics and Gynecology | Admitting: Obstetrics and Gynecology

## 2013-12-13 ENCOUNTER — Encounter (HOSPITAL_COMMUNITY): Payer: Self-pay | Admitting: *Deleted

## 2013-12-13 DIAGNOSIS — IMO0001 Reserved for inherently not codable concepts without codable children: Secondary | ICD-10-CM

## 2013-12-13 DIAGNOSIS — O479 False labor, unspecified: Secondary | ICD-10-CM | POA: Diagnosis present

## 2013-12-13 DIAGNOSIS — Z3493 Encounter for supervision of normal pregnancy, unspecified, third trimester: Secondary | ICD-10-CM

## 2013-12-13 LAB — CBC
HEMATOCRIT: 38 % (ref 36.0–46.0)
HEMOGLOBIN: 13.1 g/dL (ref 12.0–15.0)
MCH: 31.2 pg (ref 26.0–34.0)
MCHC: 34.5 g/dL (ref 30.0–36.0)
MCV: 90.5 fL (ref 78.0–100.0)
Platelets: 101 10*3/uL — ABNORMAL LOW (ref 150–400)
RBC: 4.2 MIL/uL (ref 3.87–5.11)
RDW: 13 % (ref 11.5–15.5)
WBC: 10 10*3/uL (ref 4.0–10.5)

## 2013-12-13 LAB — MRSA PCR SCREENING: MRSA by PCR: NEGATIVE

## 2013-12-13 LAB — TYPE AND SCREEN
ABO/RH(D): O POS
Antibody Screen: NEGATIVE

## 2013-12-13 LAB — RPR

## 2013-12-13 MED ORDER — ONDANSETRON HCL 4 MG/2ML IJ SOLN: 4.0000 mg | INTRAMUSCULAR | Status: DC | PRN

## 2013-12-13 MED ORDER — PRENATAL MULTIVITAMIN CH
1.0000 | ORAL_TABLET | Freq: Every day | ORAL | Status: DC
  Administered 2013-12-13 – 2013-12-14 (×2): 1 via ORAL
  Filled 2013-12-13 (×3): qty 1

## 2013-12-13 MED ORDER — DIPHENHYDRAMINE HCL 25 MG PO CAPS: 25.0000 mg | ORAL_CAPSULE | Freq: Four times a day (QID) | ORAL | Status: DC | PRN

## 2013-12-13 MED ORDER — PHENYLEPHRINE 40 MCG/ML (10ML) SYRINGE FOR IV PUSH (FOR BLOOD PRESSURE SUPPORT)
80.0000 ug | PREFILLED_SYRINGE | INTRAVENOUS | Status: DC | PRN
  Filled 2013-12-13: qty 10
  Filled 2013-12-13: qty 2

## 2013-12-13 MED ORDER — ONDANSETRON HCL 4 MG PO TABS
4.0000 mg | ORAL_TABLET | ORAL | Status: DC | PRN
Start: 2013-12-13 — End: 2013-12-15

## 2013-12-13 MED ORDER — PHENYLEPHRINE 40 MCG/ML (10ML) SYRINGE FOR IV PUSH (FOR BLOOD PRESSURE SUPPORT)
80.0000 ug | PREFILLED_SYRINGE | INTRAVENOUS | Status: DC | PRN
  Filled 2013-12-13: qty 2

## 2013-12-13 MED ORDER — WITCH HAZEL-GLYCERIN EX PADS: 1.0000 "application " | MEDICATED_PAD | CUTANEOUS | Status: DC | PRN

## 2013-12-13 MED ORDER — IBUPROFEN 600 MG PO TABS
600.0000 mg | ORAL_TABLET | Freq: Four times a day (QID) | ORAL | Status: DC
  Administered 2013-12-13 – 2013-12-15 (×9): 600 mg via ORAL
  Filled 2013-12-13 (×9): qty 1

## 2013-12-13 MED ORDER — OXYCODONE-ACETAMINOPHEN 5-325 MG PO TABS: 1.0000 | ORAL_TABLET | ORAL | Status: DC | PRN

## 2013-12-13 MED ORDER — DIBUCAINE 1 % RE OINT: 1.0000 "application " | TOPICAL_OINTMENT | RECTAL | Status: DC | PRN

## 2013-12-13 MED ORDER — BENZOCAINE-MENTHOL 20-0.5 % EX AERO
1.0000 "application " | INHALATION_SPRAY | CUTANEOUS | Status: DC | PRN
  Filled 2013-12-13: qty 56

## 2013-12-13 MED ORDER — LACTATED RINGERS IV SOLN
INTRAVENOUS | Status: DC
  Administered 2013-12-13: 04:00:00 via INTRAVENOUS

## 2013-12-13 MED ORDER — OXYCODONE-ACETAMINOPHEN 5-325 MG PO TABS: 2.0000 | ORAL_TABLET | ORAL | Status: DC | PRN

## 2013-12-13 MED ORDER — OXYCODONE-ACETAMINOPHEN 5-325 MG PO TABS
1.0000 | ORAL_TABLET | ORAL | Status: DC | PRN
  Administered 2013-12-13 – 2013-12-14 (×5): 1 via ORAL
  Filled 2013-12-13 (×5): qty 1

## 2013-12-13 MED ORDER — OXYTOCIN 40 UNITS IN LACTATED RINGERS INFUSION - SIMPLE MED
62.5000 mL/h | INTRAVENOUS | Status: DC
  Filled 2013-12-13: qty 1000

## 2013-12-13 MED ORDER — MEASLES, MUMPS & RUBELLA VAC ~~LOC~~ INJ
0.5000 mL | INJECTION | Freq: Once | SUBCUTANEOUS | Status: AC
  Administered 2013-12-15: 0.5 mL via SUBCUTANEOUS
  Filled 2013-12-13: qty 0.5

## 2013-12-13 MED ORDER — PENICILLIN G POTASSIUM 5000000 UNITS IJ SOLR
2.5000 10*6.[IU] | INTRAMUSCULAR | Status: DC
  Filled 2013-12-13 (×3): qty 2.5

## 2013-12-13 MED ORDER — EPHEDRINE 5 MG/ML INJ
10.0000 mg | INTRAVENOUS | Status: DC | PRN
  Filled 2013-12-13: qty 2

## 2013-12-13 MED ORDER — TETANUS-DIPHTH-ACELL PERTUSSIS 5-2.5-18.5 LF-MCG/0.5 IM SUSP
0.5000 mL | Freq: Once | INTRAMUSCULAR | Status: AC
  Administered 2013-12-14: 0.5 mL via INTRAMUSCULAR
  Filled 2013-12-13: qty 0.5

## 2013-12-13 MED ORDER — ONDANSETRON HCL 4 MG/2ML IJ SOLN: 4.0000 mg | Freq: Four times a day (QID) | INTRAMUSCULAR | Status: DC | PRN

## 2013-12-13 MED ORDER — OXYTOCIN BOLUS FROM INFUSION
500.0000 mL | INTRAVENOUS | Status: DC
  Administered 2013-12-13: 500 mL via INTRAVENOUS

## 2013-12-13 MED ORDER — LACTATED RINGERS IV BOLUS (SEPSIS): 500.0000 mL | Freq: Once | INTRAVENOUS | Status: DC

## 2013-12-13 MED ORDER — DEXTROSE 5 % IV SOLN
5.0000 10*6.[IU] | Freq: Once | INTRAVENOUS | Status: DC
  Filled 2013-12-13: qty 5

## 2013-12-13 MED ORDER — MISOPROSTOL 200 MCG PO TABS
ORAL_TABLET | ORAL | Status: AC
  Filled 2013-12-13: qty 4

## 2013-12-13 MED ORDER — DIPHENHYDRAMINE HCL 50 MG/ML IJ SOLN: 12.5000 mg | INTRAMUSCULAR | Status: DC | PRN

## 2013-12-13 MED ORDER — ZOLPIDEM TARTRATE 5 MG PO TABS: 5.0000 mg | ORAL_TABLET | Freq: Every evening | ORAL | Status: DC | PRN

## 2013-12-13 MED ORDER — ACETAMINOPHEN 325 MG PO TABS: 650.0000 mg | ORAL_TABLET | ORAL | Status: DC | PRN

## 2013-12-13 MED ORDER — IBUPROFEN 600 MG PO TABS
600.0000 mg | ORAL_TABLET | Freq: Four times a day (QID) | ORAL | Status: DC | PRN
  Administered 2013-12-13: 600 mg via ORAL
  Filled 2013-12-13: qty 1

## 2013-12-13 MED ORDER — LANOLIN HYDROUS EX OINT: TOPICAL_OINTMENT | CUTANEOUS | Status: DC | PRN

## 2013-12-13 MED ORDER — LACTATED RINGERS IV SOLN: 500.0000 mL | Freq: Once | INTRAVENOUS | Status: DC

## 2013-12-13 MED ORDER — SIMETHICONE 80 MG PO CHEW: 80.0000 mg | CHEWABLE_TABLET | ORAL | Status: DC | PRN

## 2013-12-13 MED ORDER — LIDOCAINE HCL (PF) 1 % IJ SOLN
30.0000 mL | INTRAMUSCULAR | Status: DC | PRN
  Filled 2013-12-13: qty 30

## 2013-12-13 MED ORDER — CITRIC ACID-SODIUM CITRATE 334-500 MG/5ML PO SOLN: 30.0000 mL | ORAL | Status: DC | PRN

## 2013-12-13 MED ORDER — MISOPROSTOL 200 MCG PO TABS
800.0000 ug | ORAL_TABLET | Freq: Once | ORAL | Status: AC
  Administered 2013-12-13: 800 ug via ORAL

## 2013-12-13 MED ORDER — FENTANYL 2.5 MCG/ML BUPIVACAINE 1/10 % EPIDURAL INFUSION (WH - ANES)
14.0000 mL/h | INTRAMUSCULAR | Status: DC | PRN
  Filled 2013-12-13: qty 125

## 2013-12-13 MED ORDER — LACTATED RINGERS IV SOLN: 500.0000 mL | INTRAVENOUS | Status: DC | PRN

## 2013-12-13 MED ORDER — SENNOSIDES-DOCUSATE SODIUM 8.6-50 MG PO TABS
2.0000 | ORAL_TABLET | ORAL | Status: DC
  Administered 2013-12-14 – 2013-12-15 (×2): 2 via ORAL
  Filled 2013-12-13 (×2): qty 2

## 2013-12-13 NOTE — Progress Notes (Signed)
UR completed 

## 2013-12-13 NOTE — H&P (Signed)
LABOR ADMISSION HISTORY AND PHYSICAL  Sandy Morton is a 18 y.o. female G2P1001 with IUP at [redacted]w[redacted]d by [redacted]w[redacted]d sono presenting for contractions. Pt presents w/ early active labor. She reports +FMs, No LOF, no VB, no blurry vision, headaches or peripheral edema, and RUQ pain. She desires an epidural for labor pain control. She plans on breast and bottle feeding. She is undecided for birth control.  Dating: By [redacted]w[redacted]d --->  Estimated Date of Delivery: 12/13/13  Prenatal History/Complications:  Past Medical History: Past Medical History  Diagnosis Date  . No pertinent past medical history   . Pregnant state, incidental   . Supervision of normal pregnancy 09/04/2013    Past Surgical History: Past Surgical History  Procedure Laterality Date  . No past surgeries      Obstetrical History: OB History   Grav Para Term Preterm Abortions TAB SAB Ect Mult Living   0 0 0 0 0 0 1      Social History: History   Social History  . Marital Status: Married    Spouse Name: N/A    Number of Children: N/A  . Years of Education: N/A   Social History Main Topics  . Smoking status: Never Smoker   . Smokeless tobacco: Never Used  . Alcohol Use: No  . Drug Use: No  . Sexual Activity: Not Currently    Birth Control/ Protection: None   Other Topics Concern  . Not on file   Social History Narrative  . No narrative on file    Family History: No family history on file.  Allergies: No Known Allergies  Prescriptions prior to admission  Medication Sig Dispense Refill  . Prenatal Multivit-Min-Fe-FA (PRENATAL VITAMINS PO) Take 1 tablet by mouth daily.      Marland Kitchen triamcinolone (KENALOG) 0.025 % cream Apply 1 application topically 2 (two) times daily. Apply to rash close to under arms  30 g  0     Review of Systems   All systems reviewed and negative except as stated in HPI  Blood pressure 127/72, pulse 75, temperature 98.5 F (36.9 C), temperature source Oral, resp. rate 18, height 5'  1" (1.549 m), weight 178 lb (80.74 kg), last menstrual period 02/28/2013, SpO2 100.00%. General appearance: alert, cooperative and mild distress Lungs: clear to auscultation bilaterally Heart: regular rate and rhythm Abdomen: soft, non-tender; bowel sounds normal Extremities: Homans sign is negative, no sign of DVT DTR's normal +2 and equal throughout Presentation: cephalic Fetal monitoringBaseline: 140 bpm, Variability: Good {> 6 bpm), Accelerations: Reactive and Decelerations: Absent Uterine activityFrequency: Every 2-3 minutes Dilation: 5.5 Effacement (%): 80 Station: -3 Exam by:: Judeth Horn RNC   Prenatal labs: ABO, Rh: O/POS/-- (06/02 1029) Antibody: NEG (06/23 0855) Rubella:  Non Immune RPR: NON REAC (06/23 0855)  HBsAg: NEGATIVE (06/02 1029)  HIV: NONREACTIVE (06/23 0855)  GBS: Detected (08/25 1125)  1 hr Glucola 163 followed by a normal 2 hr GTT Genetic screening  Not performed as patient presented late to care Anatomy US normal female  [facility]  FOB Tanicia Wolaver 27 yo wm 2nd child  Dating By 16.4wk u/s  Pap <21yo  GC/CT Initial:  -/-              36+wks:  Genetic Screen NT/IT: too late  CF screen negative  Anatomic [place] Normal female   Flu vaccine   Tdap Recommended ~ 28wks  Glucose Screen  2 hr normal: 79/163/138  GBS    positive  Feed Preference br/bott  Contraception undecided  Circumcision no  Childbirth Classes declined  Pediatrician [facility]     No results found for this or any previous visit (from the past 24 hour(s)).  Patient Active Problem List   Diagnosis Date Noted  . Active labor at term 12/13/2013  . Supervision of low-risk pregnancy 12/07/2013  . Hx of severe preeclampsia, prior pregnancy, currently pregnant 09/25/2013  . Hx of maternal blood transfusion, currently pregnant 09/25/2013  . Supervision of normal pregnancy 09/04/2013    Assessment: Sandy Morton is a 18 y.o. G2P1001 at [redacted]w[redacted]d here for contractions.  18 y.o. @  at [redacted]w[redacted]d by [redacted]w[redacted]d sono  in early active labor.    #Labor: Early active labor. Admit for expectant management. #Pain: Pt desires epidural. #FWB: Category I strip. #ID:  PCN  #MOF: breast/bottle #MOC:undecided #Circ:  declines  Constantine Ruddick ROCIO 12/13/2013, 4:24 AM

## 2013-12-13 NOTE — Lactation Note (Signed)
This note was copied from the chart of Sandy Morton. Lactation Consultation Note  Patient Name: Sandy Ashwini Jago [id number] Date: 12/13/2013 Reason for consult: Initial assessment Mom plans to BR/BO. She bottle fed her 1st baby and has been mostly bottle feeding this baby. Mom reports to [facility] she does want to BF. Encouraged Mom to BF with each feeding to encourage milk production, prevent engorgement and protect milk supply. Supply/demand reviewed. Reviewed risk of early supplementation to BF success. BF basics reviewed with Mom. Assisted Mom to latch baby at this visit, assisted with positioning and obtaining good depth. Baby demonstrates a good rhythmic suck with few swallows noted. Lots of colostrum with hand expression. Lactation brochure left for review, advised of OP services and support group. Reviewed guidelines for supplementing with BF with Mom. Encouraged to call for assist as needed.   Maternal Data Formula Feeding for Exclusion: Yes Reason for exclusion: Mother's choice to formula and breast feed on admission Has patient been taught Hand Expression?: Yes Does the patient have breastfeeding experience prior to this delivery?: No  Feeding Feeding Type: Breast Fed Nipple Type: Regular  LATCH Score/Interventions Latch: Grasps breast easily, tongue down, lips flanged, rhythmical sucking.  Audible Swallowing: A few with stimulation  Type of Nipple: Everted at rest and after stimulation  Comfort (Breast/Nipple): Soft / non-tender     Hold (Positioning): Assistance needed to correctly position infant at breast and maintain latch. Intervention(s): Breastfeeding basics reviewed;Support Pillows;Position options;Skin to skin  LATCH Score: 8  Lactation Tools Discussed/Used WIC Program: Yes   Consult Status Consult Status: Follow-up Date: 12/14/13 Follow-up type: In-patient    Alfred Levins 12/13/2013, 3:44 PM

## 2013-12-14 ENCOUNTER — Encounter: Payer: Medicaid Other | Admitting: Obstetrics & Gynecology

## 2013-12-14 MED ORDER — INFLUENZA VAC SPLIT QUAD 0.5 ML IM SUSY
0.5000 mL | PREFILLED_SYRINGE | INTRAMUSCULAR | Status: AC
  Administered 2013-12-14: 0.5 mL via INTRAMUSCULAR

## 2013-12-14 NOTE — Progress Notes (Signed)
Post Partum Day 1 Subjective:  Sandy Morton is a 18 y.o. [id number] [redacted]w[redacted]d s/p normal spontaneous vaginal delivery.  No acute events overnight.  Pt denies problems with ambulating, voiding or po intake.  She denies nausea or vomiting.  Pain is moderately controlled.  She has had flatus. She has not had bowel movement.  Lochia Small.  Plan for birth control is undecided.  Method of Feeding: Breast/ Bottle  Objective: Blood pressure 105/46, pulse 80, temperature 98.6 F (37 C), temperature source Oral, resp. rate 18, height  (1.549 m), weight 80.74 kg (178 lb), last menstrual period 02/28/2013, SpO2 100.00%, unknown if currently breastfeeding.  Physical Exam:  General: alert, cooperative and no distress Lochia:normal flow Chest: CTAB Heart: RRR no m/r/g Abdomen: +BS, soft, nontender,  Uterine Fundus: firm, below the umbilicus DVT Evaluation: No evidence of DVT seen on physical exam. Extremities: no peripheral edema   Recent Labs  12/13/13 0410  HGB 13.1  HCT 38.0    Assessment/Plan:  ASSESSMENT: Sandy Morton is a 18 y.o. [id number] [redacted]w[redacted]d s/p NSVD  Plan for discharge tomorrow and Breastfeeding   LOS: 1 day   Azucena Cecil, PA-S2 12/14/2013, 7:09 AM   I have seen and examined this patient and agree the above assessment. CRESENZO-DISHMAN,Zafar Debrosse 12/18/2013 10:15 AM

## 2013-12-15 MED ORDER — IBUPROFEN 600 MG PO TABS: 600.0000 mg | ORAL_TABLET | Freq: Four times a day (QID) | ORAL | Status: DC

## 2013-12-15 NOTE — Lactation Note (Signed)
This note was copied from the chart of Sandy Donnalyn Juran. Lactation Consultation Note  Baby is sleeping on FOB's chest. Mother states baby is hungry after breastfeeding so she has been supplementing with formula. Reviewed supply and demand.  Provided volume guidelines and reviewed engorgement care. Mother's nipples are sore.  Gave mother comfort gels and reviewed use. Encouraged mother to call if she needs further assistance.  Patient Name: Sandy Morton [id number] Date: 12/15/2013 Reason for consult: Follow-up assessment   Maternal Data    Feeding Feeding Type: Breast Fed Length of feed: 10 min  LATCH Score/Interventions                      Lactation Tools Discussed/Used     Consult Status Consult Status: Complete    Hardie Pulley 12/15/2013, 11:33 AM

## 2013-12-15 NOTE — Discharge Summary (Signed)
Obstetric Discharge Summary Reason for Admission: onset of labor Prenatal Procedures: ultrasound Intrapartum Procedures: spontaneous vaginal delivery Postpartum Procedures: none Complications-Operative and Postpartum: periurethral, hemostatic laceration Hemoglobin  Date Value Ref Range Status  12/13/2013 13.1  12.0 - 15.0 g/dL Final     HCT  Date Value Ref Range Status  12/13/2013 38.0  36.0 - 46.0 % Final   Hospital Course  Sandy Morton is a 18 y.o. female G2P1001 with IUP at [redacted]w[redacted]d by [redacted]w[redacted]d sono presenting for contractions. Pt presents w/ early active labor.  At 5:14 AM on 09/10 a viable female was delivered via Vaginal, Spontaneous Delivery (Presentation: ROA ). Placenta status: intact.   Anesthesia: none  Episiotomy: n/a  Lacerations: 1st degree hemostatic periurethral  Suture Repair: none required  Est. Blood Loss ( ) s/p cytotec 800 mg rectally She is breast and bottle feeding. She is undecided for birth control.   Physical Exam:  General: alert, cooperative and no distress Lochia: appropriate Uterine Fundus: firm Incision: n/a DVT Evaluation: No evidence of DVT seen on physical exam.  Discharge Diagnoses: Term Pregnancy-delivered  Discharge Information: Date: 12/15/2013 Activity: unrestricted and pelvic rest Diet: routine Medications: Ibuprofen Condition: stable Instructions: refer to practice specific booklet Discharge to: home Follow-up Information   Follow up with [facility] In 5 weeks.   Contact information:   [address] [place] [address] [phone]      Newborn Data: Live born female  Birth Weight: 7 lb 7.8 oz (3395 g) APGAR: 9, 9  Home with mother.  Tomma Rakers, MD, PGY3 12/15/2013, 7:40 AM  I have seen and examined this patient and I agree with the above. Cam Hai CNM 12:50 AM 12/20/2013

## 2013-12-21 ENCOUNTER — Telehealth: Payer: Self-pay | Admitting: Obstetrics & Gynecology

## 2014-01-22 NOTE — H&P (Signed)
`````  Attestation of Attending Supervision of Advanced Practitioner: Evaluation and management procedures were performed by the PA/NP/CNM/OB Fellow under my supervision/collaboration. Chart reviewed and agree with management and plan.  Kelita Wallis V 01/22/2014 1:25 PM

## 2014-02-04 ENCOUNTER — Encounter (HOSPITAL_COMMUNITY): Payer: Self-pay | Admitting: *Deleted

## 2014-02-11 ENCOUNTER — Encounter: Payer: Self-pay | Admitting: Women's Health

## 2014-02-11 ENCOUNTER — Ambulatory Visit: Payer: Medicaid Other | Admitting: Women's Health

## 2014-02-20 ENCOUNTER — Encounter: Payer: Self-pay | Admitting: Women's Health

## 2014-02-20 ENCOUNTER — Ambulatory Visit (INDEPENDENT_AMBULATORY_CARE_PROVIDER_SITE_OTHER): Payer: Medicaid Other | Admitting: Women's Health

## 2014-02-20 NOTE — Progress Notes (Signed)
Patient ID: Sandy LoopAnaIsabel Morton, female   DOB: 02/18/1996, 18 y.o.   MRN: [id number] Subjective:    Sandy LoopnaIsabel Morton is a 18 y.o. [id number] Hispanic female who presents for a postpartum visit. She is 8 weeks postpartum following a spontaneous vaginal delivery at 40.0 gestational weeks. Anesthesia: none. I have fully reviewed the prenatal and intrapartum course. Postpartum course has been uncomplicated. Baby's course has been uncomplicated. Baby is feeding by bottle. Bleeding no bleeding. Bowel function is normal. Bladder function is normal. Patient is not sexually active. Last sexual activity: prior to birth of baby. Contraception method is none, unsure what kind of contraception she wants at this point, discussed and wants to think about it some more, to use condoms if sexually active. Postpartum depression screening: negative. Score 0.  Last pap n/a <21yo.  The following portions of the patient's history were reviewed and updated as appropriate: allergies, current medications, past medical history, past surgical history and problem list.  Review of Systems Pertinent items are noted in HPI.   Filed Vitals:   02/20/14 1122  BP: 120/80  Height: 5\' 2"  (1.575 m)  Weight: 157 lb (71.215 kg)   Patient's last menstrual period was 01/22/2014.  Objective:   General:  alert, cooperative and no distress   Breasts:  deferred, no complaints  Lungs: clear to auscultation bilaterally  Heart:  regular rate and rhythm  Abdomen: soft, nontender   Vulva: normal  Vagina: normal vagina  Cervix:  closed  Corpus: Well-involuted  Adnexa:  Non-palpable  Rectal Exam: No hemorrhoids        Assessment:   Postpartum exam 8 wks s/p SVB Bottlefeeding Depression screening Contraception counseling   Plan:   Contraception: condoms, call if decides for other contraception Follow up in: April when turns 19yo  for pap & physical or earlier if needed  Marge DuncansBooker, Eryck Negron Randall CNM, [facility] HospitalWHNP-BC 02/20/2014 11:54 AM

## 2014-06-19 ENCOUNTER — Telehealth: Payer: Self-pay | Admitting: *Deleted

## 2014-06-19 NOTE — Telephone Encounter (Signed)
[facility] never received info on baby being born. I let her know pt delivered a female on 12/13/13. JSY

## 2015-09-23 ENCOUNTER — Encounter: Payer: Self-pay | Admitting: Family Medicine

## 2015-09-23 LAB — POCT PREGNANCY, URINE: PREG TEST UR: POSITIVE — AB

## 2015-11-03 ENCOUNTER — Encounter (INDEPENDENT_AMBULATORY_CARE_PROVIDER_SITE_OTHER): Payer: Self-pay

## 2015-11-03 ENCOUNTER — Encounter: Payer: Self-pay | Admitting: Advanced Practice Midwife

## 2015-11-03 ENCOUNTER — Telehealth: Payer: Self-pay

## 2015-11-03 ENCOUNTER — Other Ambulatory Visit (HOSPITAL_COMMUNITY)
Admission: RE | Admit: 2015-11-03 | Discharge: 2015-11-03 | Disposition: A | Discharge status: Home / Self Care | Payer: Medicaid Other | Source: Ambulatory Visit | Attending: Advanced Practice Midwife | Admitting: Advanced Practice Midwife

## 2015-11-03 ENCOUNTER — Ambulatory Visit (INDEPENDENT_AMBULATORY_CARE_PROVIDER_SITE_OTHER): Payer: Medicaid Other | Admitting: Advanced Practice Midwife

## 2015-11-03 ENCOUNTER — Encounter: Payer: Self-pay | Admitting: *Deleted

## 2015-11-03 VITALS — BP 125/70 | HR 85 | Wt 162.0 lb

## 2015-11-03 DIAGNOSIS — Z113 Encounter for screening for infections with a predominantly sexual mode of transmission: Secondary | ICD-10-CM | POA: Insufficient documentation

## 2015-11-03 DIAGNOSIS — Z01419 Encounter for gynecological examination (general) (routine) without abnormal findings: Secondary | ICD-10-CM | POA: Insufficient documentation

## 2015-11-03 DIAGNOSIS — Z3481 Encounter for supervision of other normal pregnancy, first trimester: Secondary | ICD-10-CM

## 2015-11-03 DIAGNOSIS — Z36 Encounter for antenatal screening of mother: Secondary | ICD-10-CM

## 2015-11-03 DIAGNOSIS — Z3492 Encounter for supervision of normal pregnancy, unspecified, second trimester: Secondary | ICD-10-CM

## 2015-11-03 DIAGNOSIS — O09291 Supervision of pregnancy with other poor reproductive or obstetric history, first trimester: Secondary | ICD-10-CM

## 2015-11-03 DIAGNOSIS — O09293 Supervision of pregnancy with other poor reproductive or obstetric history, third trimester: Secondary | ICD-10-CM

## 2015-11-03 NOTE — Patient Instructions (Addendum)
First Trimester of Pregnancy The first trimester of pregnancy is from week 1 until the end of week 12 (months 1 through 3). A week after a sperm fertilizes an egg, the egg will implant on the wall of the uterus. This embryo will begin to develop into a baby. Genes from you and your partner are forming the baby. The female genes determine whether the baby is a boy or a girl. At 6-8 weeks, the eyes and face are formed, and the heartbeat can be seen on ultrasound. At the end of 12 weeks, all the baby's organs are formed.  Now that you are pregnant, you will want to do everything you can to have a healthy baby. Two of the most important things are to get good prenatal care and to follow your health care provider's instructions. Prenatal care is all the medical care you receive before the baby's birth. This care will help prevent, find, and treat any problems during the pregnancy and childbirth. BODY CHANGES Your body goes through many changes during pregnancy. The changes vary from woman to woman.   You may gain or lose a couple of pounds at first.  You may feel sick to your stomach (nauseous) and throw up (vomit). If the vomiting is uncontrollable, call your health care provider.  You may tire easily.  You may develop headaches that can be relieved by medicines approved by your health care provider.  You may urinate more often. Painful urination may mean you have a bladder infection.  You may develop heartburn as a result of your pregnancy.  You may develop constipation because certain hormones are causing the muscles that push waste through your intestines to slow down.  You may develop hemorrhoids or swollen, bulging veins (varicose veins).  Your breasts may begin to grow larger and become tender. Your nipples may stick out more, and the tissue that surrounds them (areola) may become darker.  Your gums may bleed and may be sensitive to brushing and flossing.  Dark spots or blotches (chloasma,  mask of pregnancy) may develop on your face. This will likely fade after the baby is born.  Your menstrual periods will stop.  You may have a loss of appetite.  You may develop cravings for certain kinds of food.  You may have changes in your emotions from day to day, such as being excited to be pregnant or being concerned that something may go wrong with the pregnancy and baby.  You may have more vivid and strange dreams.  You may have changes in your hair. These can include thickening of your hair, rapid growth, and changes in texture. Some women also have hair loss during or after pregnancy, or hair that feels dry or thin. Your hair will most likely return to normal after your baby is born. WHAT TO EXPECT AT YOUR PRENATAL VISITS During a routine prenatal visit:  You will be weighed to make sure you and the baby are growing normally.  Your blood pressure will be taken.  Your abdomen will be measured to track your baby's growth.  The fetal heartbeat will be listened to starting around week 10 or 12 of your pregnancy.  Test results from any previous visits will be discussed. Your health care provider may ask you:  How you are feeling.  If you are feeling the baby move.  If you have had any abnormal symptoms, such as leaking fluid, bleeding, severe headaches, or abdominal cramping.  If you are using any tobacco products,   including cigarettes, chewing tobacco, and electronic cigarettes.  If you have any questions. Other tests that may be performed during your first trimester include:  Blood tests to find your blood type and to check for the presence of any previous infections. They will also be used to check for low iron levels (anemia) and Rh antibodies. Later in the pregnancy, blood tests for diabetes will be done along with other tests if problems develop.  Urine tests to check for infections, diabetes, or protein in the urine.  An ultrasound to confirm the proper growth  and development of the baby.  An amniocentesis to check for possible genetic problems.  Fetal screens for spina bifida and Down syndrome.  You may need other tests to make sure you and the baby are doing well.  HIV (human immunodeficiency virus) testing. Routine prenatal testing includes screening for HIV, unless you choose not to have this test. HOME CARE INSTRUCTIONS  Medicines  Follow your health care provider's instructions regarding medicine use. Specific medicines may be either safe or unsafe to take during pregnancy.  Take your prenatal vitamins as directed.  If you develop constipation, try taking a stool softener if your health care provider approves. Diet  Eat regular, well-balanced meals. Choose a variety of foods, such as meat or vegetable-based protein, fish, milk and low-fat dairy products, vegetables, fruits, and whole grain breads and cereals. Your health care provider will help you determine the amount of weight gain that is right for you.  Avoid raw meat and uncooked cheese. These carry germs that can cause birth defects in the baby.  Eating four or five small meals rather than three large meals a day may help relieve nausea and vomiting. If you start to feel nauseous, eating a few soda crackers can be helpful. Drinking liquids between meals instead of during meals also seems to help nausea and vomiting.  If you develop constipation, eat more high-fiber foods, such as fresh vegetables or fruit and whole grains. Drink enough fluids to keep your urine clear or pale yellow. Activity and Exercise  Exercise only as directed by your health care provider. Exercising will help you:  Control your weight.  Stay in shape.  Be prepared for labor and delivery.  Experiencing pain or cramping in the lower abdomen or low back is a good sign that you should stop exercising. Check with your health care provider before continuing normal exercises.  Try to avoid standing for long  periods of time. Move your legs often if you must stand in one place for a long time.  Avoid heavy lifting.  Wear low-heeled shoes, and practice good posture.  You may continue to have sex unless your health care provider directs you otherwise. Relief of Pain or Discomfort  Wear a good support bra for breast tenderness.   Take warm sitz baths to soothe any pain or discomfort caused by hemorrhoids. Use hemorrhoid cream if your health care provider approves.   Rest with your legs elevated if you have leg cramps or low back pain.  If you develop varicose veins in your legs, wear support hose. Elevate your feet for 15 minutes, 3-4 times a day. Limit salt in your diet. Prenatal Care  Schedule your prenatal visits by the twelfth week of pregnancy. They are usually scheduled monthly at first, then more often in the last 2 months before delivery.  Write down your questions. Take them to your prenatal visits.  Keep all your prenatal visits as directed by your   health care provider. Safety  Wear your seat belt at all times when driving.  Make a list of emergency phone numbers, including numbers for family, friends, the hospital, and police and fire departments. General Tips  Ask your health care provider for a referral to a local prenatal education class. Begin classes no later than at the beginning of month 6 of your pregnancy.  Ask for help if you have counseling or nutritional needs during pregnancy. Your health care provider can offer advice or refer you to specialists for help with various needs.  Do not use hot tubs, steam rooms, or saunas.  Do not douche or use tampons or scented sanitary pads.  Do not cross your legs for long periods of time.  Avoid cat litter boxes and soil used by cats. These carry germs that can cause birth defects in the baby and possibly loss of the fetus by miscarriage or stillbirth.  Avoid all smoking, herbs, alcohol, and medicines not prescribed by  your health care provider. Chemicals in these affect the formation and growth of the baby.  Do not use any tobacco products, including cigarettes, chewing tobacco, and electronic cigarettes. If you need help quitting, ask your health care provider. You may receive counseling support and other resources to help you quit.  Schedule a dentist appointment. At home, brush your teeth with a soft toothbrush and be gentle when you floss. SEEK MEDICAL CARE IF:   You have dizziness.  You have mild pelvic cramps, pelvic pressure, or nagging pain in the abdominal area.  You have persistent nausea, vomiting, or diarrhea.  You have a bad smelling vaginal discharge.  You have pain with urination.  You notice increased swelling in your face, hands, legs, or ankles. SEEK IMMEDIATE MEDICAL CARE IF:   You have a fever.  You are leaking fluid from your vagina.  You have spotting or bleeding from your vagina.  You have severe abdominal cramping or pain.  You have rapid weight gain or loss.  You vomit blood or material that looks like coffee grounds.  You are exposed to [place] measles and have never had them.  You are exposed to fifth disease or chickenpox.  You develop a severe headache.  You have shortness of breath.  You have any kind of trauma, such as from a fall or a car accident.   This information is not intended to replace advice given to you by your health care provider. Make sure you discuss any questions you have with your health care provider.   Document Released: 03/16/2001 Document Revised: 04/12/2014 Document Reviewed: 01/30/2013 Elsevier Interactive Patient Education 2016 [employer].   You need to take one (1) baby aspirin every day     First Trimester of Pregnancy The first trimester of pregnancy is from week 1 until the end of week 12 (months 1 through 3). A week after a sperm fertilizes an egg, the egg will implant on the wall of the uterus. This embryo will  begin to develop into a baby. Genes from you and your partner are forming the baby. The female genes determine whether the baby is a boy or a girl. At 6-8 weeks, the eyes and face are formed, and the heartbeat can be seen on ultrasound. At the end of 12 weeks, all the baby's organs are formed.  Now that you are pregnant, you will want to do everything you can to have a healthy baby. Two of the most important things are to get good  prenatal care and to follow your health care provider's instructions. Prenatal care is all the medical care you receive before the baby's birth. This care will help prevent, find, and treat any problems during the pregnancy and childbirth. BODY CHANGES Your body goes through many changes during pregnancy. The changes vary from woman to woman.   You may gain or lose a couple of pounds at first.  You may feel sick to your stomach (nauseous) and throw up (vomit). If the vomiting is uncontrollable, call your health care provider.  You may tire easily.  You may develop headaches that can be relieved by medicines approved by your health care provider.  You may urinate more often. Painful urination may mean you have a bladder infection.  You may develop heartburn as a result of your pregnancy.  You may develop constipation because certain hormones are causing the muscles that push waste through your intestines to slow down.  You may develop hemorrhoids or swollen, bulging veins (varicose veins).  Your breasts may begin to grow larger and become tender. Your nipples may stick out more, and the tissue that surrounds them (areola) may become darker.  Your gums may bleed and may be sensitive to brushing and flossing.  Dark spots or blotches (chloasma, mask of pregnancy) may develop on your face. This will likely fade after the baby is born.  Your menstrual periods will stop.  You may have a loss of appetite.  You may develop cravings for certain kinds of food.  You may  have changes in your emotions from day to day, such as being excited to be pregnant or being concerned that something may go wrong with the pregnancy and baby.  You may have more vivid and strange dreams.  You may have changes in your hair. These can include thickening of your hair, rapid growth, and changes in texture. Some women also have hair loss during or after pregnancy, or hair that feels dry or thin. Your hair will most likely return to normal after your baby is born. WHAT TO EXPECT AT YOUR PRENATAL VISITS During a routine prenatal visit:  You will be weighed to make sure you and the baby are growing normally.  Your blood pressure will be taken.  Your abdomen will be measured to track your baby's growth.  The fetal heartbeat will be listened to starting around week 10 or 12 of your pregnancy.  Test results from any previous visits will be discussed. Your health care provider may ask you:  How you are feeling.  If you are feeling the baby move.  If you have had any abnormal symptoms, such as leaking fluid, bleeding, severe headaches, or abdominal cramping.  If you are using any tobacco products, including cigarettes, chewing tobacco, and electronic cigarettes.  If you have any questions. Other tests that may be performed during your first trimester include:  Blood tests to find your blood type and to check for the presence of any previous infections. They will also be used to check for low iron levels (anemia) and Rh antibodies. Later in the pregnancy, blood tests for diabetes will be done along with other tests if problems develop.  Urine tests to check for infections, diabetes, or protein in the urine.  An ultrasound to confirm the proper growth and development of the baby.  An amniocentesis to check for possible genetic problems.  Fetal screens for spina bifida and Down syndrome.  You may need other tests to make sure you and the  baby are doing well.  HIV (human  immunodeficiency virus) testing. Routine prenatal testing includes screening for HIV, unless you choose not to have this test. HOME CARE INSTRUCTIONS  Medicines  Follow your health care provider's instructions regarding medicine use. Specific medicines may be either safe or unsafe to take during pregnancy.  Take your prenatal vitamins as directed.  If you develop constipation, try taking a stool softener if your health care provider approves. Diet  Eat regular, well-balanced meals. Choose a variety of foods, such as meat or vegetable-based protein, fish, milk and low-fat dairy products, vegetables, fruits, and whole grain breads and cereals. Your health care provider will help you determine the amount of weight gain that is right for you.  Avoid raw meat and uncooked cheese. These carry germs that can cause birth defects in the baby.  Eating four or five small meals rather than three large meals a day may help relieve nausea and vomiting. If you start to feel nauseous, eating a few soda crackers can be helpful. Drinking liquids between meals instead of during meals also seems to help nausea and vomiting.  If you develop constipation, eat more high-fiber foods, such as fresh vegetables or fruit and whole grains. Drink enough fluids to keep your urine clear or pale yellow. Activity and Exercise  Exercise only as directed by your health care provider. Exercising will help you:  Control your weight.  Stay in shape.  Be prepared for labor and delivery.  Experiencing pain or cramping in the lower abdomen or low back is a good sign that you should stop exercising. Check with your health care provider before continuing normal exercises.  Try to avoid standing for long periods of time. Move your legs often if you must stand in one place for a long time.  Avoid heavy lifting.  Wear low-heeled shoes, and practice good posture.  You may continue to have sex unless your health care provider  directs you otherwise. Relief of Pain or Discomfort  Wear a good support bra for breast tenderness.   Take warm sitz baths to soothe any pain or discomfort caused by hemorrhoids. Use hemorrhoid cream if your health care provider approves.   Rest with your legs elevated if you have leg cramps or low back pain.  If you develop varicose veins in your legs, wear support hose. Elevate your feet for 15 minutes, 3-4 times a day. Limit salt in your diet. Prenatal Care  Schedule your prenatal visits by the twelfth week of pregnancy. They are usually scheduled monthly at first, then more often in the last 2 months before delivery.  Write down your questions. Take them to your prenatal visits.  Keep all your prenatal visits as directed by your health care provider. Safety  Wear your seat belt at all times when driving.  Make a list of emergency phone numbers, including numbers for family, friends, the hospital, and police and fire departments. General Tips  Ask your health care provider for a referral to a local prenatal education class. Begin classes no later than at the beginning of month 6 of your pregnancy.  Ask for help if you have counseling or nutritional needs during pregnancy. Your health care provider can offer advice or refer you to specialists for help with various needs.  Do not use hot tubs, steam rooms, or saunas.  Do not douche or use tampons or scented sanitary pads.  Do not cross your legs for long periods of time.  Avoid cat litter  boxes and soil used by cats. These carry germs that can cause birth defects in the baby and possibly loss of the fetus by miscarriage or stillbirth.  Avoid all smoking, herbs, alcohol, and medicines not prescribed by your health care provider. Chemicals in these affect the formation and growth of the baby.  Do not use any tobacco products, including cigarettes, chewing tobacco, and electronic cigarettes. If you need help quitting, ask your  health care provider. You may receive counseling support and other resources to help you quit.  Schedule a dentist appointment. At home, brush your teeth with a soft toothbrush and be gentle when you floss. SEEK MEDICAL CARE IF:   You have dizziness.  You have mild pelvic cramps, pelvic pressure, or nagging pain in the abdominal area.  You have persistent nausea, vomiting, or diarrhea.  You have a bad smelling vaginal discharge.  You have pain with urination.  You notice increased swelling in your face, hands, legs, or ankles. SEEK IMMEDIATE MEDICAL CARE IF:   You have a fever.  You are leaking fluid from your vagina.  You have spotting or bleeding from your vagina.  You have severe abdominal cramping or pain.  You have rapid weight gain or loss.  You vomit blood or material that looks like coffee grounds.  You are exposed to [place] measles and have never had them.  You are exposed to fifth disease or chickenpox.  You develop a severe headache.  You have shortness of breath.  You have any kind of trauma, such as from a fall or a car accident.   This information is not intended to replace advice given to you by your health care provider. Make sure you discuss any questions you have with your health care provider.   Document Released: 03/16/2001 Document Revised: 04/12/2014 Document Reviewed: 01/30/2013 Elsevier Interactive Patient Education [employer]

## 2015-11-03 NOTE — Telephone Encounter (Signed)
Discussed with patient that she have family planning medicaid and need to contact medicaid office to get her medicaid switched to pregnancy medicaid.

## 2015-11-03 NOTE — Progress Notes (Signed)
Bedside U/S shows IUP with FHT of 160BPM and CRL is61.29mm  GA is [redacted]w[redacted]d

## 2015-11-03 NOTE — Progress Notes (Signed)
  Subjective:    Sandy Morton is a [id number] [redacted]w[redacted]d being seen today for her first obstetrical visit.  Her obstetrical history is significant for severe preeclampsia with prior pregnancy, history of blood transfusion. Patient does intend to breast feed. Pregnancy history fully reviewed.  Patient reports no complaints.  Vitals:   11/03/15 0906  BP: 125/70  Pulse: 85  Weight: 162 lb (73.5 kg)    HISTORY: OB History  Gravida Para Term Preterm AB Living  3 2 2  0 0 2  SAB TAB Ectopic Multiple Live Births  0 0 0 0 2    # Outcome Date GA Lbr Len/2nd Weight Sex Delivery Anes PTL Lv  3 Current           2 Term 12/13/13 [redacted]w[redacted]d 04:10 / 00:04 7 lb 7.8 oz (3.395 kg) M Vag-Spont None  LIV  1 Term 06/20/11 [redacted]w[redacted]d 06:00 / 02:16 7 lb 4.2 oz (3.294 kg) M Vag-Spont EPI  LIV     Past Medical History:  Diagnosis Date  . No pertinent past medical history   . Pregnant state, incidental   . Supervision of normal pregnancy 09/04/2013   Past Surgical History:  Procedure Laterality Date  . NO PAST SURGERIES     History reviewed. No pertinent family history.   Exam    Uterus:  Fundal Height: 12 cm  Pelvic Exam:    Perineum: No Hemorrhoids, Normal Perineum   Vulva: Bartholin's, Urethra, Skene's normal   Vagina:  normal discharge   pH:    Cervix: no bleeding following Pap and no cervical motion tenderness   Adnexa: no mass, fullness, tenderness   Bony Pelvis: gynecoid  System: Breast:  normal appearance, no masses or tenderness   Skin: normal coloration and turgor, no rashes    Neurologic: oriented, grossly non-focal   Extremities: normal strength, tone, and muscle mass   HEENT neck supple with midline trachea   Mouth/Teeth mucous membranes moist, pharynx normal without lesions   Neck supple and no masses   Cardiovascular: regular rate and rhythm, no murmurs or gallops   Respiratory:  appears well, vitals normal, no respiratory distress, acyanotic, normal RR, ear and throat exam is normal,  neck free of mass or lymphadenopathy, chest clear, no wheezing, crepitations, rhonchi, normal symmetric air entry   Abdomen: soft, non-tender; bowel sounds normal; no masses,  no organomegaly   Urinary: urethral meatus normal      Assessment:    Pregnancy: [id number] Patient Active Problem List   Diagnosis Date Noted  . Hx of severe preeclampsia, prior pregnancy, currently pregnant 09/25/2013  . Hx of maternal blood transfusion, currently pregnant 09/25/2013        Plan:     Initial labs drawn. Prenatal vitamins. Problem list reviewed and updated. Genetic Screening discussed Quad Screen: ordered.  Ultrasound discussed; fetal survey: requested.  Follow up in 4 weeks. 50% of 30 min visit spent on counseling and coordination of care.    Discussed prior preeclampsia. Will recommend starting baby aspirin daily.  Pap done Will need to do baseline 24 hr urine  [facility] 11/03/2015

## 2015-11-04 LAB — COMPREHENSIVE METABOLIC PANEL
ALK PHOS: 51 U/L (ref 33–115)
ALT: 12 U/L (ref 6–29)
AST: 14 U/L (ref 10–30)
Albumin: 4.2 g/dL (ref 3.6–5.1)
BILIRUBIN TOTAL: 0.4 mg/dL (ref 0.2–1.2)
BUN: 11 mg/dL (ref 7–25)
CO2: 26 mmol/L (ref 20–31)
CREATININE: 0.56 mg/dL (ref 0.50–1.10)
Calcium: 9.3 mg/dL (ref 8.6–10.2)
Chloride: 104 mmol/L (ref 98–110)
Glucose, Bld: 87 mg/dL (ref 65–99)
POTASSIUM: 4.3 mmol/L (ref 3.5–5.3)
SODIUM: 137 mmol/L (ref 135–146)
TOTAL PROTEIN: 6.9 g/dL (ref 6.1–8.1)

## 2015-11-04 LAB — HIV ANTIBODY (ROUTINE TESTING W REFLEX): HIV 1&2 Ab, 4th Generation: NONREACTIVE

## 2015-11-04 LAB — CYTOLOGY - PAP

## 2015-11-05 LAB — OBSTETRIC PANEL
Antibody Screen: NEGATIVE
Basophils Absolute: 0 cells/uL (ref 0–200)
Basophils Relative: 0 %
EOS ABS: 164 {cells}/uL (ref 15–500)
Eosinophils Relative: 2 %
HEMATOCRIT: 41.9 % (ref 35.0–45.0)
HEP B S AG: NEGATIVE
Hemoglobin: 14.1 g/dL (ref 11.7–15.5)
Lymphocytes Relative: 21 %
Lymphs Abs: 1722 cells/uL (ref 850–3900)
MCH: 31.5 pg (ref 27.0–33.0)
MCHC: 33.7 g/dL (ref 32.0–36.0)
MCV: 93.7 fL (ref 80.0–100.0)
MONO ABS: 492 {cells}/uL (ref 200–950)
MPV: 11.4 fL (ref 7.5–12.5)
Monocytes Relative: 6 %
NEUTROS ABS: 5822 {cells}/uL (ref 1500–7800)
Neutrophils Relative %: 71 %
Platelets: 197 10*3/uL (ref 140–400)
RBC: 4.47 MIL/uL (ref 3.80–5.10)
RDW: 13.8 % (ref 11.0–15.0)
Rh Type: POSITIVE
Rubella: 3.98 Index — ABNORMAL HIGH (ref <0.90)
WBC: 8.2 10*3/uL (ref 3.8–10.8)

## 2015-11-05 LAB — CULTURE, URINE COMPREHENSIVE: Organism ID, Bacteria: NO GROWTH

## 2015-12-01 ENCOUNTER — Ambulatory Visit (INDEPENDENT_AMBULATORY_CARE_PROVIDER_SITE_OTHER): Payer: Medicaid Other | Admitting: Advanced Practice Midwife

## 2015-12-01 VITALS — BP 126/73 | HR 75 | Wt 168.0 lb

## 2015-12-01 DIAGNOSIS — O09292 Supervision of pregnancy with other poor reproductive or obstetric history, second trimester: Secondary | ICD-10-CM

## 2015-12-01 DIAGNOSIS — Z1389 Encounter for screening for other disorder: Secondary | ICD-10-CM

## 2015-12-01 DIAGNOSIS — Z3482 Encounter for supervision of other normal pregnancy, second trimester: Secondary | ICD-10-CM

## 2015-12-01 DIAGNOSIS — Z3490 Encounter for supervision of normal pregnancy, unspecified, unspecified trimester: Secondary | ICD-10-CM | POA: Insufficient documentation

## 2015-12-01 NOTE — Patient Instructions (Signed)

## 2015-12-01 NOTE — Addendum Note (Signed)
Addended by: Granville LewisLARK, LORA L on: 12/01/2015 10:17 AM   Modules accepted: Orders

## 2015-12-01 NOTE — Progress Notes (Signed)
Subjective:  Sandy Morton is a 20 y.o. G3P2002 at 1102w6d being seen today for ongoing prenatal care.  She is currently monitored for the following issues for this low-risk pregnancy and has Hx of severe preeclampsia, prior pregnancy, currently pregnant; Hx of maternal blood transfusion, currently pregnant; and Supervision of normal pregnancy, antepartum on her problem list.  Patient reports no complaints.  Contractions: Not present. Vag. Bleeding: None.  Movement: Absent. Denies leaking of fluid.   The following portions of the patient's history were reviewed and updated as appropriate: allergies, current medications, past family history, past medical history, past social history, past surgical history and problem list. Problem list updated.  Objective:   Vitals:   12/01/15 0902  BP: 126/73  Pulse: 75  Weight: 168 lb (76.2 kg)    Fetal Status: Fetal Heart Rate (bpm): 145 Fundal Height: 17 cm Movement: Absent     General:  Alert, oriented and cooperative. Patient is in no acute distress.  Skin: Skin is warm and dry. No rash noted.   Cardiovascular: Normal heart rate noted  Respiratory: Normal respiratory effort, no problems with respiration noted  Abdomen: Soft, gravid, appropriate for gestational age. Pain/Pressure: Present     Pelvic:  Cervical exam deferred        Extremities: Normal range of motion.  Edema: None  Mental Status: Normal mood and affect. Normal behavior. Normal judgment and thought content.   Urinalysis: Urine Protein: Negative Urine Glucose: Negative  Assessment and Plan:  Pregnancy: G3P2002 at 352w6d  1. Supervision of normal pregnancy, antepartum, second trimester  - US MFM OB DETAIL +14 WK; Future - Quad screen   2. Encounter for routine screening for malformation using ultrasonics  - US MFM OB DETAIL +14 WK; Future  3. Hx of preeclampsia, prior pregnancy, currently pregnant, second trimester  - Protein / creatinine ratio, urine  Preterm labor  symptoms and general obstetric precautions including but not limited to vaginal bleeding, contractions, leaking of fluid and fetal movement were reviewed in detail with the patient. Please refer to After Visit Summary for other counseling recommendations.  Return in about 4 weeks (around 12/29/2015) for ROB.   Dorathy KinsmanVirginia Verdene Creson, CNM

## 2015-12-02 LAB — AFP, QUAD SCREEN
AFP: 22.3 ng/mL
Age Alone: 1:1130 {titer}
CURR GEST AGE: 16.9 wk
HCG TOTAL: 16.6 [IU]/mL
INH: 99.8 pg/mL
Interpretation-AFP: NEGATIVE
MOM FOR AFP: 0.69
MOM FOR INH: 0.76
MoM for hCG: 0.64
OPEN SPINA BIFIDA: NEGATIVE
Osb Risk: <1:27300 {titer}
Tri 18 Scr Risk Est: NEGATIVE
Trisomy 18 (Edward) Syndrome Interp.: 1:538 {titer}
uE3 Mom: 0.46
uE3 Value: 0.54 ng/mL

## 2015-12-02 LAB — PROTEIN / CREATININE RATIO, URINE: CREATININE, URINE: 43 mg/dL (ref 20–320)

## 2015-12-09 ENCOUNTER — Encounter (HOSPITAL_COMMUNITY): Payer: Self-pay | Admitting: Advanced Practice Midwife

## 2015-12-15 ENCOUNTER — Encounter (HOSPITAL_COMMUNITY): Payer: Self-pay

## 2015-12-15 ENCOUNTER — Ambulatory Visit (HOSPITAL_COMMUNITY)
Admission: RE | Admit: 2015-12-15 | Discharge: 2015-12-15 | Disposition: A | Discharge status: Home / Self Care | Payer: Medicaid Other | Source: Ambulatory Visit | Attending: Advanced Practice Midwife | Admitting: Advanced Practice Midwife

## 2015-12-15 ENCOUNTER — Other Ambulatory Visit: Payer: Self-pay | Admitting: Advanced Practice Midwife

## 2015-12-15 DIAGNOSIS — Z36 Encounter for antenatal screening of mother: Secondary | ICD-10-CM | POA: Insufficient documentation

## 2015-12-15 DIAGNOSIS — Z3482 Encounter for supervision of other normal pregnancy, second trimester: Secondary | ICD-10-CM

## 2015-12-15 DIAGNOSIS — O09292 Supervision of pregnancy with other poor reproductive or obstetric history, second trimester: Secondary | ICD-10-CM

## 2015-12-15 DIAGNOSIS — Z1389 Encounter for screening for other disorder: Secondary | ICD-10-CM

## 2015-12-15 DIAGNOSIS — Z3A18 18 weeks gestation of pregnancy: Secondary | ICD-10-CM

## 2015-12-29 ENCOUNTER — Encounter: Payer: Self-pay | Admitting: Certified Nurse Midwife

## 2015-12-29 ENCOUNTER — Ambulatory Visit (INDEPENDENT_AMBULATORY_CARE_PROVIDER_SITE_OTHER): Payer: Medicaid Other | Admitting: Certified Nurse Midwife

## 2015-12-29 VITALS — BP 123/70 | HR 81 | Wt 170.0 lb

## 2015-12-29 DIAGNOSIS — Z23 Encounter for immunization: Secondary | ICD-10-CM | POA: Diagnosis not present

## 2015-12-29 DIAGNOSIS — O09299 Supervision of pregnancy with other poor reproductive or obstetric history, unspecified trimester: Secondary | ICD-10-CM | POA: Insufficient documentation

## 2015-12-29 DIAGNOSIS — O09292 Supervision of pregnancy with other poor reproductive or obstetric history, second trimester: Secondary | ICD-10-CM

## 2015-12-29 DIAGNOSIS — Z3482 Encounter for supervision of other normal pregnancy, second trimester: Secondary | ICD-10-CM

## 2015-12-29 MED ORDER — PRENATAL VITAMIN 27-0.8 MG PO TABS: 1.0000 | ORAL_TABLET | Freq: Every day | ORAL | 6 refills | Status: DC

## 2015-12-29 MED ORDER — ASPIRIN 81 MG PO TABS: 81.0000 mg | ORAL_TABLET | Freq: Every day | ORAL | 4 refills | Status: DC

## 2015-12-29 NOTE — Progress Notes (Signed)
Subjective:  Sandy Morton is a 20 y.o. G3P2002 at 6532w6d being seen today for ongoing prenatal care.  She is currently monitored for the following issues for this low-risk pregnancy and has Hx of severe preeclampsia, prior pregnancy, currently pregnant; Hx of maternal blood transfusion, currently pregnant; and Supervision of normal pregnancy, antepartum on her problem list.  Patient reports no complaints.  Contractions: Not present. Vag. Bleeding: None.  Movement: Absent. Denies leaking of fluid.   The following portions of the patient's history were reviewed and updated as appropriate: allergies, current medications, past family history, past medical history, past social history, past surgical history and problem list. Problem list updated.  Objective:   Vitals:   12/29/15 0843  BP: 123/70  Pulse: 81  Weight: 170 lb (77.1 kg)    Fetal Status: Fetal Heart Rate (bpm): 144 Fundal Height: 21 cm Movement: Absent     General:  Alert, oriented and cooperative. Patient is in no acute distress.  Skin: Skin is warm and dry. No rash noted.   Cardiovascular: Normal heart rate noted  Respiratory: Normal respiratory effort, no problems with respiration noted  Abdomen: Soft, gravid, appropriate for gestational age. Pain/Pressure: Absent     Pelvic: Vag. Bleeding: None Vag D/C Character: Thin   Cervical exam deferred        Extremities: Normal range of motion.  Edema: None  Mental Status: Normal mood and affect. Normal behavior. Normal judgment and thought content.   Urinalysis: Urine Protein: Negative Urine Glucose: Negative  Assessment and Plan:  Pregnancy: G3P2002 at 8332w6d  1. Flu vaccine need - Flu Vaccine QUAD 36+ mos IM (Fluarix, Quad PF)  2. Supervision of normal pregnancy, antepartum, second trimester - Prenatal Vit-Fe Fumarate-FA (PRENATAL VITAMIN) 27-0.8 MG TABS; Take 1 tablet by mouth daily.  Dispense: 30 tablet; Refill: 6  3. History of pre-eclampsia in prior pregnancy,  currently pregnant, second trimester - aspirin 81 MG tablet; Take 1 tablet (81 mg total) by mouth daily.  Dispense: 30 tablet; Refill: 4  Preterm labor symptoms and general obstetric precautions including but not limited to vaginal bleeding, contractions, leaking of fluid and fetal movement were reviewed in detail with the patient. Please refer to After Visit Summary for other counseling recommendations.  Return in about 4 weeks (around 01/26/2016).   Donette LarryMelanie Jeramine Delis, CNM

## 2016-01-26 ENCOUNTER — Encounter: Payer: Self-pay | Admitting: *Deleted

## 2016-01-26 ENCOUNTER — Ambulatory Visit (INDEPENDENT_AMBULATORY_CARE_PROVIDER_SITE_OTHER): Payer: Self-pay | Admitting: Advanced Practice Midwife

## 2016-01-26 DIAGNOSIS — Z3490 Encounter for supervision of normal pregnancy, unspecified, unspecified trimester: Secondary | ICD-10-CM

## 2016-01-26 DIAGNOSIS — Z3492 Encounter for supervision of normal pregnancy, unspecified, second trimester: Secondary | ICD-10-CM

## 2016-01-26 NOTE — Patient Instructions (Signed)

## 2016-01-27 NOTE — Progress Notes (Signed)
   PRENATAL VISIT NOTE  Subjective:  Sandy Morton is a 20 y.o. G3P2002 at 8938w0d being seen today for ongoing prenatal care.  She is currently monitored for the following issues for this high-risk pregnancy and has Hx of severe preeclampsia, prior pregnancy, currently pregnant; Hx of maternal blood transfusion, currently pregnant; and Supervision of normal pregnancy, antepartum on her problem list.  Patient reports no complaints.  Contractions: Not present. Vag. Bleeding: None.  Movement: Present. Denies leaking of fluid.   The following portions of the patient's history were reviewed and updated as appropriate: allergies, current medications, past family history, past medical history, past social history, past surgical history and problem list. Problem list updated.  Objective:   Vitals:   01/26/16 0918  BP: 122/75  Pulse: (!) 108  Weight: 171 lb (77.6 kg)    Fetal Status: Fetal Heart Rate (bpm): 144   Movement: Present     General:  Alert, oriented and cooperative. Patient is in no acute distress.  Skin: Skin is warm and dry. No rash noted.   Cardiovascular: Normal heart rate noted  Respiratory: Normal respiratory effort, no problems with respiration noted  Abdomen: Soft, gravid, appropriate for gestational age. Pain/Pressure: Absent     Pelvic:  Cervical exam deferred        Extremities: Normal range of motion.  Edema: None  Mental Status: Normal mood and affect. Normal behavior. Normal judgment and thought content.   Assessment and Plan:  Pregnancy: G3P2002 at 3238w0d  1. Encounter for supervision of normal pregnancy, antepartum, unspecified gravidity      Is taking her aspirin      No headaches or vision changes      Glucola next visit  Preterm labor symptoms and general obstetric precautions including but not limited to vaginal bleeding, contractions, leaking of fluid and fetal movement were reviewed in detail with the patient. Please refer to After Visit Summary for  other counseling recommendations.  Return in about 4 weeks (around 02/23/2016) [place] [facility]  Aviva SignsMarie L Greg Cratty, CNM

## 2016-02-23 ENCOUNTER — Ambulatory Visit (INDEPENDENT_AMBULATORY_CARE_PROVIDER_SITE_OTHER): Payer: Medicaid Other | Admitting: Advanced Practice Midwife

## 2016-02-23 VITALS — BP 111/62 | HR 92 | Wt 179.0 lb

## 2016-02-23 DIAGNOSIS — O9989 Other specified diseases and conditions complicating pregnancy, childbirth and the puerperium: Secondary | ICD-10-CM

## 2016-02-23 DIAGNOSIS — Z23 Encounter for immunization: Secondary | ICD-10-CM | POA: Diagnosis not present

## 2016-02-23 DIAGNOSIS — Z3492 Encounter for supervision of normal pregnancy, unspecified, second trimester: Secondary | ICD-10-CM

## 2016-02-23 DIAGNOSIS — Z3483 Encounter for supervision of other normal pregnancy, third trimester: Secondary | ICD-10-CM

## 2016-02-23 DIAGNOSIS — N949 Unspecified condition associated with female genital organs and menstrual cycle: Secondary | ICD-10-CM

## 2016-02-23 DIAGNOSIS — Z3480 Encounter for supervision of other normal pregnancy, unspecified trimester: Secondary | ICD-10-CM

## 2016-02-23 NOTE — Progress Notes (Signed)
   PRENATAL VISIT NOTE  Subjective:  Rita Deloris PingSpiers is a 20 y.o. G3P2002 at 3794w6d being seen today for ongoing prenatal care.  She is currently monitored for the following issues for this low-risk pregnancy and has Hx of severe preeclampsia, prior pregnancy, currently pregnant; Hx of maternal blood transfusion, currently pregnant; and Supervision of normal pregnancy, antepartum on her problem list.  Patient reports groin pain that make it difficult to walk. Denies contractions, LOF, VB, urinary complaints. .  Contractions: Not present. Vag. Bleeding: None.  Movement: Present. Denies leaking of fluid.   The following portions of the patient's history were reviewed and updated as appropriate: allergies, current medications, past family history, past medical history, past social history, past surgical history and problem list. Problem list updated.  Objective:   Vitals:   02/23/16 0810  BP: 111/62  Pulse: 92  Weight: 179 lb (81.2 kg)    Fetal Status: Fetal Heart Rate (bpm): 141 Fundal Height: 28 cm Movement: Present     General:  Alert, oriented and cooperative. Patient is in no acute distress.  Skin: Skin is warm and dry. No rash noted.   Cardiovascular: Normal heart rate noted  Respiratory: Normal respiratory effort, no problems with respiration noted  Abdomen: Soft, gravid, appropriate for gestational age. Pain/Pressure: Present     Pelvic:  Cervical exam deferred        Extremities: Normal range of motion.  Edema: None  Mental Status: Normal mood and affect. Normal behavior. Normal judgment and thought content.   Assessment and Plan:  Pregnancy: G3P2002 at 2294w6d  1. Normal pregnancy in second trimester  - Glucose Tolerance, 1 HR (50g) - CBC - RPR - HIV antibody (with reflex) - Tdap vaccine greater than or equal to 7yo IM  2. Encounter for supervision of other normal pregnancy in third trimester   3. Supervision of other normal pregnancy, antepartum   4. Round  Ligament pain - Rec maternity support bele - PTL precautions.    Preterm labor symptoms and general obstetric precautions including but not limited to vaginal bleeding, contractions, leaking of fluid and fetal movement were reviewed in detail with the patient. Please refer to After Visit Summary for other counseling recommendations.  Return in 2 weeks (on 03/08/2016).   Dorathy KinsmanVirginia Darryn Kydd, CNM

## 2016-02-23 NOTE — Patient Instructions (Signed)
. °Preterm Labor and Birth Information °The normal length of a pregnancy is 39-41 weeks. Preterm labor is when labor starts before 37 completed weeks of pregnancy. °What are the risk factors for preterm labor? °Preterm labor is more likely to occur in women who: °· Have certain infections during pregnancy such as a bladder infection, sexually transmitted infection, or infection inside the uterus (chorioamnionitis). °· Have a shorter-than-normal cervix. °· Have gone into preterm labor before. °· Have had surgery on their cervix. °· Are younger than age 17 or older than age 35. °· Are African American. °· Are pregnant with twins or multiple babies (multiple gestation). °· Take street drugs or smoke while pregnant. °· Do not gain enough weight while pregnant. °· Became pregnant shortly after having been pregnant. °What are the symptoms of preterm labor? °Symptoms of preterm labor include: °· Cramps similar to those that can happen during a menstrual period. The cramps may happen with diarrhea. °· Pain in the abdomen or lower back. °· Regular uterine contractions that may feel like tightening of the abdomen. °· A feeling of increased pressure in the pelvis. °· Increased watery or bloody mucus discharge from the vagina. °· Water breaking (ruptured amniotic sac). °Why is it important to recognize signs of preterm labor? °It is important to recognize signs of preterm labor because babies who are born prematurely may not be fully developed. This can put them at an increased risk for: °· Long-term (chronic) heart and lung problems. °· Difficulty immediately after birth with regulating body systems, including blood sugar, body temperature, heart rate, and breathing rate. °· Bleeding in the brain. °· Cerebral palsy. °· Learning difficulties. °· Death. °These risks are highest for babies who are born before 34 weeks of pregnancy. °How is preterm labor treated? °Treatment depends on the length of your pregnancy, your condition,  and the health of your baby. It may involve: °· Having a stitch (suture) placed in your cervix to prevent your cervix from opening too early (cerclage). °· Taking or being given medicines, such as: °¨ Hormone medicines. These may be given early in pregnancy to help support the pregnancy. °¨ Medicine to stop contractions. °¨ Medicines to help mature the baby’s lungs. These may be prescribed if the risk of delivery is high. °¨ Medicines to prevent your baby from developing cerebral palsy. °If the labor happens before 34 weeks of pregnancy, you may need to stay in the hospital. °What should I do if I think I am in preterm labor? °If you think that you are going into preterm labor, call your health care provider right away. °How can I prevent preterm labor in future pregnancies? °To increase your chance of having a full-term pregnancy: °· Do not use any tobacco products, such as cigarettes, chewing tobacco, and e-cigarettes. If you need help quitting, ask your health care provider. °· Do not use street drugs or medicines that have not been prescribed to you during your pregnancy. °· Talk with your health care provider before taking any herbal supplements, even if you have been taking them regularly. °· Make sure you gain a healthy amount of weight during your pregnancy. °· Watch for infection. If you think that you might have an infection, get it checked right away. °· Make sure to tell your health care provider if you have gone into preterm labor before. °This information is not intended to replace advice given to you by your health care provider. Make sure you discuss any questions you have with your   health care provider. °Document Released: 06/12/2003 Document Revised: 09/02/2015 Document Reviewed: 08/13/2015 °Elsevier Interactive Patient Education © 2017 [employer] ° °

## 2016-02-24 LAB — CBC
HEMATOCRIT: 36.9 % (ref 35.0–45.0)
Hemoglobin: 12.5 g/dL (ref 11.7–15.5)
MCH: 31.9 pg (ref 27.0–33.0)
MCHC: 33.9 g/dL (ref 32.0–36.0)
MCV: 94.1 fL (ref 80.0–100.0)
MPV: 12.5 fL (ref 7.5–12.5)
PLATELETS: 150 10*3/uL (ref 140–400)
RBC: 3.92 MIL/uL (ref 3.80–5.10)
RDW: 13.3 % (ref 11.0–15.0)
WBC: 7.8 10*3/uL (ref 3.8–10.8)

## 2016-02-24 LAB — GLUCOSE TOLERANCE, 1 HOUR (50G) W/O FASTING: GLUCOSE, 1 HR, GESTATIONAL: 109 mg/dL (ref <140)

## 2016-02-24 LAB — HIV ANTIBODY (ROUTINE TESTING W REFLEX): HIV 1&2 Ab, 4th Generation: NONREACTIVE

## 2016-02-25 ENCOUNTER — Telehealth: Payer: Self-pay | Admitting: *Deleted

## 2016-02-25 LAB — RPR

## 2016-02-25 NOTE — Telephone Encounter (Signed)
LM on voicemail of normal 28 week labs. 

## 2016-03-08 ENCOUNTER — Ambulatory Visit (INDEPENDENT_AMBULATORY_CARE_PROVIDER_SITE_OTHER): Payer: Self-pay | Admitting: Advanced Practice Midwife

## 2016-03-08 VITALS — BP 127/69 | HR 85 | Wt 179.0 lb

## 2016-03-08 DIAGNOSIS — O09292 Supervision of pregnancy with other poor reproductive or obstetric history, second trimester: Secondary | ICD-10-CM

## 2016-03-08 DIAGNOSIS — Z3480 Encounter for supervision of other normal pregnancy, unspecified trimester: Secondary | ICD-10-CM

## 2016-03-08 MED ORDER — ASPIRIN 81 MG PO TABS: 81.0000 mg | ORAL_TABLET | Freq: Every day | ORAL | 4 refills | Status: DC

## 2016-03-08 NOTE — Patient Instructions (Signed)
Braxton Hicks Contractions °Contractions of the uterus can occur throughout pregnancy. Contractions are not always a sign that you are in labor.  °WHAT ARE BRAXTON HICKS CONTRACTIONS?  °Contractions that occur before labor are called Braxton Hicks contractions, or false labor. Toward the end of pregnancy (32-34 weeks), these contractions can develop more often and may become more forceful. This is not true labor because these contractions do not result in opening (dilatation) and thinning of the cervix. They are sometimes difficult to tell apart from true labor because these contractions can be forceful and people have different pain tolerances. You should not feel embarrassed if you go to the hospital with false labor. Sometimes, the only way to tell if you are in true labor is for your health care provider to look for changes in the cervix. °If there are no prenatal problems or other health problems associated with the pregnancy, it is completely safe to be sent home with false labor and await the onset of true labor. °HOW CAN YOU TELL THE DIFFERENCE BETWEEN TRUE AND FALSE LABOR? °False Labor  °· The contractions of false labor are usually shorter and not as hard as those of true labor.   °· The contractions are usually irregular.   °· The contractions are often felt in the front of the lower abdomen and in the groin.   °· The contractions may go away when you walk around or change positions while lying down.   °· The contractions get weaker and are shorter lasting as time goes on.   °· The contractions do not usually become progressively stronger, regular, and closer together as with true labor.   °True Labor  °· Contractions in true labor last 30-70 seconds, become very regular, usually become more intense, and increase in frequency.   °· The contractions do not go away with walking.   °· The discomfort is usually felt in the top of the uterus and spreads to the lower abdomen and low back.   °· True labor can be  determined by your health care provider with an exam. This will show that the cervix is dilating and getting thinner.   °WHAT TO REMEMBER °· Keep up with your usual exercises and follow other instructions given by your health care provider.   °· Take medicines as directed by your health care provider.   °· Keep your regular prenatal appointments.   °· Eat and drink lightly if you think you are going into labor.   °· If Braxton Hicks contractions are making you uncomfortable:   °¨ Change your position from lying down or resting to walking, or from walking to resting.   °¨ Sit and rest in a tub of warm water.   °¨ Drink 2-3 glasses of water. Dehydration may cause these contractions.   °¨ Do slow and deep breathing several times an hour.   °WHEN SHOULD I SEEK IMMEDIATE MEDICAL CARE? °Seek immediate medical care if: °· Your contractions become stronger, more regular, and closer together.   °· You have fluid leaking or gushing from your vagina.   °· You have a fever.   °· You pass blood-tinged mucus.   °· You have vaginal bleeding.   °· You have continuous abdominal pain.   °· You have low back pain that you never had before.   °· You feel your baby's head pushing down and causing pelvic pressure.   °· Your baby is not moving as much as it used to.   °This information is not intended to replace advice given to you by your health care provider. Make sure you discuss any questions you have with your health care   provider. °Document Released: 03/22/2005 Document Revised: 07/14/2015 Document Reviewed: 01/01/2013 °Elsevier Interactive Patient Education © 2017 [employer] Inc[employer] ° °

## 2016-03-08 NOTE — Progress Notes (Signed)
   PRENATAL VISIT NOTE  Subjective:  Sandy Morton is a 20 y.o. G3P2002 at 5817w6d being seen today for ongoing prenatal care.  She is currently monitored for the following issues for this low-risk pregnancy and has Hx of severe preeclampsia, prior pregnancy, currently pregnant; Hx of maternal blood transfusion, currently pregnant; and Supervision of normal pregnancy, antepartum on her problem list.  Patient reports no complaints.  Contractions: Not present. Vag. Bleeding: None.  Movement: Present. Denies leaking of fluid.   The following portions of the patient's history were reviewed and updated as appropriate: allergies, current medications, past family history, past medical history, past social history, past surgical history and problem list. Problem list updated.  Objective:   Vitals:   03/08/16 0855  BP: 127/69  Pulse: 85  Weight: 179 lb (81.2 kg)    Fetal Status: Fetal Heart Rate (bpm): 154 Fundal Height: 31 cm Movement: Present  Presentation: Vertex  General:  Alert, oriented and cooperative. Patient is in no acute distress.  Skin: Skin is warm and dry. No rash noted.   Cardiovascular: Normal heart rate noted  Respiratory: Normal respiratory effort, no problems with respiration noted  Abdomen: Soft, gravid, appropriate for gestational age. Pain/Pressure: Present     Pelvic:  Cervical exam deferred        Extremities: Normal range of motion.  Edema: None  Mental Status: Normal mood and affect. Normal behavior. Normal judgment and thought content.  Reviewed 28 week labs  Assessment and Plan:  Pregnancy: G3P2002 at 3817w6d  1. History of pre-eclampsia in prior pregnancy, currently pregnant, second trimester  - aspirin 81 MG tablet; Take 1 tablet (81 mg total) by mouth daily.  Dispense: 30 tablet; Refill: 4  2. Supervision of other normal pregnancy, antepartum   Preterm labor symptoms and general obstetric precautions including but not limited to vaginal bleeding,  contractions, leaking of fluid and fetal movement were reviewed in detail with the patient. Please refer to After Visit Summary for other counseling recommendations.  Return in about 2 weeks (around 03/22/2016) for ROB.   [name], CNM

## 2016-03-24 ENCOUNTER — Ambulatory Visit (INDEPENDENT_AMBULATORY_CARE_PROVIDER_SITE_OTHER): Payer: Self-pay | Admitting: Obstetrics & Gynecology

## 2016-03-24 VITALS — BP 132/80 | HR 98 | Wt 182.0 lb

## 2016-03-24 DIAGNOSIS — O09299 Supervision of pregnancy with other poor reproductive or obstetric history, unspecified trimester: Secondary | ICD-10-CM

## 2016-03-24 DIAGNOSIS — O09293 Supervision of pregnancy with other poor reproductive or obstetric history, third trimester: Secondary | ICD-10-CM

## 2016-03-24 NOTE — Progress Notes (Signed)
   PRENATAL VISIT NOTE  Subjective:  Sandy Morton is a 20 y.o. G3P2002 at 981w1d being seen today for ongoing prenatal care.  She is currently monitored for the following issues for this high-risk pregnancy and has Hx of severe preeclampsia, prior pregnancy, currently pregnant; Hx of maternal blood transfusion, currently pregnant; and Supervision of normal pregnancy, antepartum on her problem list.  Patient reports no complaints.  Contractions: Not present. Vag. Bleeding: None.  Movement: Present. Denies leaking of fluid.   The following portions of the patient's history were reviewed and updated as appropriate: allergies, current medications, past family history, past medical history, past social history, past surgical history and problem list. Problem list updated.  Objective:   Vitals:   03/24/16 0945  BP: 132/80  Pulse: 98  Weight: 182 lb (82.6 kg)    Fetal Status: Fetal Heart Rate (bpm): 145 Fundal Height: 33 cm Movement: Present     General:  Alert, oriented and cooperative. Patient is in no acute distress.  Skin: Skin is warm and dry. No rash noted.   Cardiovascular: Normal heart rate noted  Respiratory: Normal respiratory effort, no problems with respiration noted  Abdomen: Soft, gravid, appropriate for gestational age. Pain/Pressure: Present     Pelvic:  Cervical exam deferred        Extremities: Normal range of motion.  Edema: None  Mental Status: Normal mood and affect. Normal behavior. Normal judgment and thought content.   Assessment and Plan:  Pregnancy: G3P2002 at 741w1d  1.  History of severe pre E -no signs of Pre E  2.  Overweight -Weight gain reviewed.  Preterm labor symptoms and general obstetric precautions including but not limited to vaginal bleeding, contractions, leaking of fluid and fetal movement were reviewed in detail with the patient. Please refer to After Visit Summary for other counseling recommendations.  Return in about 2 weeks (around  04/07/2016).   Lesly DukesKelly H Sreshta Cressler, MD

## 2016-04-05 NOTE — L&D Delivery Note (Signed)
Delivery Note At 6:08 PM a viable and healthy female was delivered via Vaginal, Spontaneous Delivery (Presentation: LOA).  APGAR: 8, 9; weight 7 lb 4.2 oz (3294 g).   Placenta status: Spontaneous, intact.  Cord:  with the following complications: Tight Nuchal, reduced after delivery of head and anterior shoulder.  Cord pH: NA  Anesthesia: Epidural Episiotomy: None Lacerations: None Suture Repair: NA Est. Blood Loss (mL): 150  Mom to postpartum.  Baby to Couplet care / Skin to Skin.  Sandy Morton 05/13/2016, 8:07 PM

## 2016-04-07 ENCOUNTER — Encounter: Payer: Self-pay | Admitting: Obstetrics & Gynecology

## 2016-04-12 ENCOUNTER — Encounter: Payer: Self-pay | Admitting: Advanced Practice Midwife

## 2016-04-12 ENCOUNTER — Ambulatory Visit (INDEPENDENT_AMBULATORY_CARE_PROVIDER_SITE_OTHER): Payer: Medicaid Other | Admitting: Advanced Practice Midwife

## 2016-04-12 VITALS — BP 132/74 | HR 90 | Wt 185.0 lb

## 2016-04-12 DIAGNOSIS — Z3483 Encounter for supervision of other normal pregnancy, third trimester: Secondary | ICD-10-CM

## 2016-04-12 DIAGNOSIS — Z3480 Encounter for supervision of other normal pregnancy, unspecified trimester: Secondary | ICD-10-CM

## 2016-04-12 LAB — OB RESULTS CONSOLE GBS: STREP GROUP B AG: NEGATIVE

## 2016-04-12 LAB — OB RESULTS CONSOLE GC/CHLAMYDIA
Chlamydia: NEGATIVE
Gonorrhea: NEGATIVE

## 2016-04-12 NOTE — Patient Instructions (Signed)

## 2016-04-13 LAB — GC/CHLAMYDIA PROBE AMP
CT Probe RNA: NOT DETECTED
GC PROBE AMP APTIMA: NOT DETECTED

## 2016-04-14 NOTE — Progress Notes (Signed)
   PRENATAL VISIT NOTE  Subjective:  Sandy Morton is a 21 y.o. G3P2002 at 1462w1d being seen today for ongoing prenatal care.  She is currently monitored for the following issues for this high-risk pregnancy and has Hx of severe preeclampsia, prior pregnancy, currently pregnant; Hx of maternal blood transfusion, currently pregnant; and Supervision of normal pregnancy, antepartum on her problem list.  Patient reports no complaints.  Contractions: Not present. Vag. Bleeding: None.  Movement: Present. Denies leaking of fluid.   The following portions of the patient's history were reviewed and updated as appropriate: allergies, current medications, past family history, past medical history, past social history, past surgical history and problem list. Problem list updated.  Objective:   Vitals:   04/12/16 0931  BP: 132/74  Pulse: 90  Weight: 185 lb (83.9 kg)    Fetal Status: Fetal Heart Rate (bpm): 150   Movement: Present     General:  Alert, oriented and cooperative. Patient is in no acute distress.  Skin: Skin is warm and dry. No rash noted.   Cardiovascular: Normal heart rate noted  Respiratory: Normal respiratory effort, no problems with respiration noted  Abdomen: Soft, gravid, appropriate for gestational age. Pain/Pressure: Present     Pelvic:  Cervical exam deferred        Extremities: Normal range of motion.  Edema: None  Mental Status: Normal mood and affect. Normal behavior. Normal judgment and thought content.   Assessment and Plan:  Pregnancy: G3P2002 at 2962w1d  1. Supervision of other normal pregnancy, antepartum       - Culture, beta strep (group b only) - GC/Chlamydia Probe Amp  Preterm labor symptoms and general obstetric precautions including but not limited to vaginal bleeding, contractions, leaking of fluid and fetal movement were reviewed in detail with the patient. Please refer to After Visit Summary for other counseling recommendations.  Return in about 1  week (around 04/19/2016) [place] [facility] [facility].   Aviva SignsMarie L Williams, CNM

## 2016-04-15 LAB — CULTURE, BETA STREP (GROUP B ONLY)

## 2016-04-19 ENCOUNTER — Ambulatory Visit (INDEPENDENT_AMBULATORY_CARE_PROVIDER_SITE_OTHER): Payer: Medicaid Other | Admitting: Advanced Practice Midwife

## 2016-04-19 VITALS — BP 128/66 | HR 87 | Wt 187.0 lb

## 2016-04-19 DIAGNOSIS — Z3483 Encounter for supervision of other normal pregnancy, third trimester: Secondary | ICD-10-CM

## 2016-04-19 DIAGNOSIS — Z3480 Encounter for supervision of other normal pregnancy, unspecified trimester: Secondary | ICD-10-CM

## 2016-04-20 ENCOUNTER — Encounter: Payer: Self-pay | Admitting: Advanced Practice Midwife

## 2016-04-20 NOTE — Progress Notes (Signed)
   PRENATAL VISIT NOTE  Subjective:  Sandy Morton is a 21 y.o. G3P2002 at 4023w0d being seen today for ongoing prenatal care.  She is currently monitored for the following issues for this low-risk pregnancy and has Hx of severe preeclampsia, prior pregnancy, currently pregnant; Hx of maternal blood transfusion, currently pregnant; and Supervision of normal pregnancy, antepartum on her problem list.  Patient reports no complaints.  Contractions: Not present. Vag. Bleeding: None.  Movement: Present. Denies leaking of fluid.   The following portions of the patient's history were reviewed and updated as appropriate: allergies, current medications, past family history, past medical history, past social history, past surgical history and problem list. Problem list updated.  Objective:   Vitals:   04/19/16 1050  BP: 128/66  Pulse: 87  Weight: 187 lb (84.8 kg)    Fetal Status: Fetal Heart Rate (bpm): 145   Movement: Present     General:  Alert, oriented and cooperative. Patient is in no acute distress.  Skin: Skin is warm and dry. No rash noted.   Cardiovascular: Normal heart rate noted  Respiratory: Normal respiratory effort, no problems with respiration noted  Abdomen: Soft, gravid, appropriate for gestational age. Pain/Pressure: Absent     Pelvic:  Cervical exam deferred        Extremities: Normal range of motion.  Edema: None  Mental Status: Normal mood and affect. Normal behavior. Normal judgment and thought content.   Assessment and Plan:  Pregnancy: G3P2002 at 8523w0d                      Group B Strep Negative  Term labor symptoms and general obstetric precautions including but not limited to vaginal bleeding, contractions, leaking of fluid and fetal movement were reviewed in detail with the patient. Please refer to After Visit Summary for other counseling recommendations.   RTO 1 week  Aviva SignsMarie L Temeka Pore, CNM

## 2016-04-20 NOTE — Patient Instructions (Signed)

## 2016-04-26 ENCOUNTER — Ambulatory Visit (INDEPENDENT_AMBULATORY_CARE_PROVIDER_SITE_OTHER): Payer: Medicaid Other | Admitting: Advanced Practice Midwife

## 2016-04-26 VITALS — BP 129/73 | HR 85 | Wt 189.0 lb

## 2016-04-26 DIAGNOSIS — Z3483 Encounter for supervision of other normal pregnancy, third trimester: Secondary | ICD-10-CM

## 2016-04-26 DIAGNOSIS — Z3480 Encounter for supervision of other normal pregnancy, unspecified trimester: Secondary | ICD-10-CM

## 2016-04-26 NOTE — Patient Instructions (Signed)

## 2016-04-26 NOTE — Progress Notes (Signed)
   PRENATAL VISIT NOTE  Subjective:  Sandy Morton is a 21 y.o. G3P2002 at 7436w6d being seen today for ongoing prenatal care.  She is currently monitored for the following issues for this low-risk pregnancy and has Hx of severe preeclampsia, prior pregnancy, currently pregnant; Hx of maternal blood transfusion, currently pregnant; and Supervision of normal pregnancy, antepartum on her problem list.  Patient reports no complaints.  Contractions: Irregular. Vag. Bleeding: None.  Movement: Present. Denies leaking of fluid.   The following portions of the patient's history were reviewed and updated as appropriate: allergies, current medications, past family history, past medical history, past social history, past surgical history and problem list. Problem list updated.  Objective:   Vitals:   04/26/16 1008  BP: 129/73  Pulse: 85  Weight: 189 lb (85.7 kg)    Fetal Status: Fetal Heart Rate (bpm): 156 Fundal Height: 38 cm Movement: Present  Presentation: Vertex  General:  Alert, oriented and cooperative. Patient is in no acute distress.  Skin: Skin is warm and dry. No rash noted.   Cardiovascular: Normal heart rate noted  Respiratory: Normal respiratory effort, no problems with respiration noted  Abdomen: Soft, gravid, appropriate for gestational age. Pain/Pressure: Present     Pelvic:  Cervical exam performed Dilation: 1 Effacement (%): 50 Station: -2  Extremities: Normal range of motion.  Edema: None  Mental Status: Normal mood and affect. Normal behavior. Normal judgment and thought content.   Assessment and Plan:  Pregnancy: G3P2002 at 3836w6d  1. Supervision of other normal pregnancy, antepartum --Cervical exam at pt request  Term labor symptoms and general obstetric precautions including but not limited to vaginal bleeding, contractions, leaking of fluid and fetal movement were reviewed in detail with the patient. Please refer to After Visit Summary for other counseling  recommendations.  Return in about 1 week (around 05/03/2016).   Hurshel PartyLisa A Leftwich-Kirby, CNM

## 2016-05-03 ENCOUNTER — Encounter: Payer: Self-pay | Admitting: Advanced Practice Midwife

## 2016-05-03 ENCOUNTER — Ambulatory Visit (INDEPENDENT_AMBULATORY_CARE_PROVIDER_SITE_OTHER): Payer: Medicaid Other | Admitting: Advanced Practice Midwife

## 2016-05-03 DIAGNOSIS — Z3480 Encounter for supervision of other normal pregnancy, unspecified trimester: Secondary | ICD-10-CM

## 2016-05-03 DIAGNOSIS — Z3483 Encounter for supervision of other normal pregnancy, third trimester: Secondary | ICD-10-CM

## 2016-05-03 NOTE — Progress Notes (Signed)
   PRENATAL VISIT NOTE  Subjective:  Sandy Morton is a 21 y.o. G3P2002 at 4636w6d being seen today for ongoing prenatal care.  She is currently monitored for the following issues for this low-risk pregnancy and has Hx of severe preeclampsia, prior pregnancy, currently pregnant; Hx of maternal blood transfusion, currently pregnant; and Supervision of normal pregnancy, antepartum on her problem list.  Patient reports occasional contractions.  Contractions: Irregular. Vag. Bleeding: None.  Movement: Present. Denies leaking of fluid.   The following portions of the patient's history were reviewed and updated as appropriate: allergies, current medications, past family history, past medical history, past social history, past surgical history and problem list. Problem list updated.  Objective:   Vitals:   05/03/16 0852  BP: 137/72  Pulse: 90  Weight: 193 lb (87.5 kg)    Fetal Status: Fetal Heart Rate (bpm): 151 Fundal Height: 39 cm Movement: Present  Presentation: Vertex  General:  Alert, oriented and cooperative. Patient is in no acute distress.  Skin: Skin is warm and dry. No rash noted.   Cardiovascular: Normal heart rate noted  Respiratory: Normal respiratory effort, no problems with respiration noted  Abdomen: Soft, gravid, appropriate for gestational age. Pain/Pressure: Present     Pelvic:  Cervical exam performed Dilation: 1.5 Effacement (%): 50 Station: -3  Extremities: Normal range of motion.  Edema: None  Mental Status: Normal mood and affect. Normal behavior. Normal judgment and thought content.   Assessment and Plan:  Pregnancy: G3P2002 at 7836w6d  1. Supervision of other normal pregnancy, antepartum   Term labor symptoms and general obstetric precautions including but not limited to vaginal bleeding, contractions, leaking of fluid and fetal movement were reviewed in detail with the patient. Please refer to After Visit Summary for other counseling recommendations.  Return  in about 1 week (around 05/10/2016) for ROB.   Dorathy KinsmanVirginia Melva Faux, CNM

## 2016-05-03 NOTE — Patient Instructions (Addendum)
Braxton Hicks Contractions °Contractions of the uterus can occur throughout pregnancy. Contractions are not always a sign that you are in labor.  °WHAT ARE BRAXTON HICKS CONTRACTIONS?  °Contractions that occur before labor are called Braxton Hicks contractions, or false labor. Toward the end of pregnancy (32-34 weeks), these contractions can develop more often and may become more forceful. This is not true labor because these contractions do not result in opening (dilatation) and thinning of the cervix. They are sometimes difficult to tell apart from true labor because these contractions can be forceful and people have different pain tolerances. You should not feel embarrassed if you go to the hospital with false labor. Sometimes, the only way to tell if you are in true labor is for your health care provider to look for changes in the cervix. °If there are no prenatal problems or other health problems associated with the pregnancy, it is completely safe to be sent home with false labor and await the onset of true labor. °HOW CAN YOU TELL THE DIFFERENCE BETWEEN TRUE AND FALSE LABOR? °False Labor  °· The contractions of false labor are usually shorter and not as hard as those of true labor.   °· The contractions are usually irregular.   °· The contractions are often felt in the front of the lower abdomen and in the groin.   °· The contractions may go away when you walk around or change positions while lying down.   °· The contractions get weaker and are shorter lasting as time goes on.   °· The contractions do not usually become progressively stronger, regular, and closer together as with true labor.   °True Labor  °· Contractions in true labor last 30-70 seconds, become very regular, usually become more intense, and increase in frequency.   °· The contractions do not go away with walking.   °· The discomfort is usually felt in the top of the uterus and spreads to the lower abdomen and low back.   °· True labor can be  determined by your health care provider with an exam. This will show that the cervix is dilating and getting thinner.   °WHAT TO REMEMBER °· Keep up with your usual exercises and follow other instructions given by your health care provider.   °· Take medicines as directed by your health care provider.   °· Keep your regular prenatal appointments.   °· Eat and drink lightly if you think you are going into labor.   °· If Braxton Hicks contractions are making you uncomfortable:   °¨ Change your position from lying down or resting to walking, or from walking to resting.   °¨ Sit and rest in a tub of warm water.   °¨ Drink 2-3 glasses of water. Dehydration may cause these contractions.   °¨ Do slow and deep breathing several times an hour.   °WHEN SHOULD I SEEK IMMEDIATE MEDICAL CARE? °Seek immediate medical care if: °· Your contractions become stronger, more regular, and closer together.   °· You have fluid leaking or gushing from your vagina.   °· You have a fever.   °· You pass blood-tinged mucus.   °· You have vaginal bleeding.   °· You have continuous abdominal pain.   °· You have low back pain that you never had before.   °· You feel your baby's head pushing down and causing pelvic pressure.   °· Your baby is not moving as much as it used to.   °This information is not intended to replace advice given to you by your health care provider. Make sure you discuss any questions you have with your health care   provider. °Document Released: 03/22/2005 Document Revised: 07/14/2015 Document Reviewed: 01/01/2013 °Elsevier Interactive Patient Education © 2017 [employer] ° °

## 2016-05-10 ENCOUNTER — Ambulatory Visit (INDEPENDENT_AMBULATORY_CARE_PROVIDER_SITE_OTHER): Payer: Medicaid Other | Admitting: Advanced Practice Midwife

## 2016-05-10 ENCOUNTER — Telehealth (HOSPITAL_COMMUNITY): Payer: Self-pay | Admitting: *Deleted

## 2016-05-10 VITALS — BP 129/81 | HR 81 | Wt 194.0 lb

## 2016-05-10 DIAGNOSIS — O09299 Supervision of pregnancy with other poor reproductive or obstetric history, unspecified trimester: Secondary | ICD-10-CM

## 2016-05-10 DIAGNOSIS — O09293 Supervision of pregnancy with other poor reproductive or obstetric history, third trimester: Secondary | ICD-10-CM | POA: Diagnosis not present

## 2016-05-10 NOTE — Telephone Encounter (Signed)
Preadmission screen  

## 2016-05-11 ENCOUNTER — Encounter: Payer: Self-pay | Admitting: Advanced Practice Midwife

## 2016-05-11 NOTE — Patient Instructions (Signed)

## 2016-05-11 NOTE — Progress Notes (Signed)
   PRENATAL VISIT NOTE  Subjective:  Sandy Morton is a 21 y.o. G3P2002 at 5849w0d being seen today for ongoing prenatal care.  She is currently monitored for the following issues for this low-risk pregnancy and has Hx of severe preeclampsia, prior pregnancy, currently pregnant; Hx of maternal blood transfusion, currently pregnant; and Supervision of normal pregnancy, antepartum on her problem list.  Patient reports no complaints.  Contractions: Irregular. Vag. Bleeding: None.  Movement: Present. Denies leaking of fluid.   The following portions of the patient's history were reviewed and updated as appropriate: allergies, current medications, past family history, past medical history, past social history, past surgical history and problem list. Problem list updated.  Objective:   Vitals:   05/10/16 0838  BP: 129/81  Pulse: 81  Weight: 194 lb (88 kg)    Fetal Status: Fetal Heart Rate (bpm): 141   Movement: Present  Presentation: Vertex  General:  Alert, oriented and cooperative. Patient is in no acute distress.  Skin: Skin is warm and dry. No rash noted.   Cardiovascular: Normal heart rate noted  Respiratory: Normal respiratory effort, no problems with respiration noted  Abdomen: Soft, gravid, appropriate for gestational age. Pain/Pressure: Present     Pelvic:  Cervical exam deferred        Extremities: Normal range of motion.  Edema: None  Mental Status: Normal mood and affect. Normal behavior. Normal judgment and thought content.  Vertex confirmed with US IOL scheduled for next week  Assessment and Plan:  Pregnancy: G3P2002 at 2249w0d  IOL scheduled for next week 05/18/16 @ 0700  Term labor symptoms and general obstetric precautions including but not limited to vaginal bleeding, contractions, leaking of fluid and fetal movement were reviewed in detail with the patient. Please refer to After Visit Summary for other counseling recommendations.     Aviva SignsMarie L Tri Chittick, CNM

## 2016-05-12 ENCOUNTER — Encounter (HOSPITAL_COMMUNITY): Payer: Self-pay

## 2016-05-12 ENCOUNTER — Inpatient Hospital Stay (HOSPITAL_COMMUNITY)
Admission: AD | Admit: 2016-05-12 | Discharge: 2016-05-13 | Disposition: A | Discharge status: Home / Self Care | Payer: Medicaid Other | Source: Ambulatory Visit | Attending: Obstetrics & Gynecology | Admitting: Obstetrics & Gynecology

## 2016-05-12 NOTE — MAU Note (Signed)
Pt c/o lower abdominal cramping and some bloody mucous that started tonight around 10 pm. Denies regular contractions. +FM. Denies LOF. Cervix has not been checked recently.

## 2016-05-13 ENCOUNTER — Encounter (HOSPITAL_COMMUNITY): Payer: Self-pay | Admitting: *Deleted

## 2016-05-13 ENCOUNTER — Inpatient Hospital Stay (HOSPITAL_COMMUNITY): Payer: Medicaid Other | Admitting: Anesthesiology

## 2016-05-13 ENCOUNTER — Inpatient Hospital Stay (HOSPITAL_COMMUNITY)
Admission: AD | Admit: 2016-05-13 | Discharge: 2016-05-15 | DRG: 775 | Disposition: A | Discharge status: Home / Self Care | Payer: Medicaid Other | Source: Ambulatory Visit | Attending: Obstetrics and Gynecology | Admitting: Obstetrics and Gynecology

## 2016-05-13 DIAGNOSIS — Z3493 Encounter for supervision of normal pregnancy, unspecified, third trimester: Secondary | ICD-10-CM | POA: Diagnosis present

## 2016-05-13 DIAGNOSIS — O9912 Other diseases of the blood and blood-forming organs and certain disorders involving the immune mechanism complicating childbirth: Secondary | ICD-10-CM | POA: Diagnosis present

## 2016-05-13 DIAGNOSIS — D696 Thrombocytopenia, unspecified: Secondary | ICD-10-CM | POA: Diagnosis present

## 2016-05-13 DIAGNOSIS — Z3A40 40 weeks gestation of pregnancy: Secondary | ICD-10-CM

## 2016-05-13 DIAGNOSIS — Z3490 Encounter for supervision of normal pregnancy, unspecified, unspecified trimester: Secondary | ICD-10-CM

## 2016-05-13 LAB — CBC
HEMATOCRIT: 38.8 % (ref 36.0–46.0)
HEMATOCRIT: 37 % (ref 36.0–46.0)
HEMOGLOBIN: 13.3 g/dL (ref 12.0–15.0)
HEMOGLOBIN: 12.7 g/dL (ref 12.0–15.0)
MCH: 31.2 pg (ref 26.0–34.0)
MCH: 31.5 pg (ref 26.0–34.0)
MCHC: 34.3 g/dL (ref 30.0–36.0)
MCHC: 34.3 g/dL (ref 30.0–36.0)
MCV: 91.1 fL (ref 78.0–100.0)
MCV: 91.8 fL (ref 78.0–100.0)
Platelets: 111 10*3/uL — ABNORMAL LOW (ref 150–400)
Platelets: 112 10*3/uL — ABNORMAL LOW (ref 150–400)
RBC: 4.26 MIL/uL (ref 3.87–5.11)
RBC: 4.03 MIL/uL (ref 3.87–5.11)
RDW: 13.7 % (ref 11.5–15.5)
RDW: 13.7 % (ref 11.5–15.5)
WBC: 7.4 10*3/uL (ref 4.0–10.5)
WBC: 10.2 10*3/uL (ref 4.0–10.5)

## 2016-05-13 LAB — TYPE AND SCREEN
ABO/RH(D): O POS
ANTIBODY SCREEN: NEGATIVE

## 2016-05-13 MED ORDER — BENZOCAINE-MENTHOL 20-0.5 % EX AERO
1.0000 "application " | INHALATION_SPRAY | CUTANEOUS | Status: DC | PRN
  Filled 2016-05-13: qty 56

## 2016-05-13 MED ORDER — SOD CITRATE-CITRIC ACID 500-334 MG/5ML PO SOLN
30.0000 mL | ORAL | Status: DC | PRN
Start: 2016-05-13 — End: 2016-05-13

## 2016-05-13 MED ORDER — OXYTOCIN BOLUS FROM INFUSION
500.0000 mL | Freq: Once | INTRAVENOUS | Status: AC
  Administered 2016-05-13: 500 mL via INTRAVENOUS

## 2016-05-13 MED ORDER — OXYCODONE-ACETAMINOPHEN 5-325 MG PO TABS: 2.0000 | ORAL_TABLET | ORAL | Status: DC | PRN

## 2016-05-13 MED ORDER — IBUPROFEN 600 MG PO TABS
600.0000 mg | ORAL_TABLET | Freq: Four times a day (QID) | ORAL | Status: DC
  Administered 2016-05-13 – 2016-05-15 (×7): 600 mg via ORAL
  Filled 2016-05-13 (×6): qty 1

## 2016-05-13 MED ORDER — EPHEDRINE 5 MG/ML INJ
10.0000 mg | INTRAVENOUS | Status: DC | PRN
  Filled 2016-05-13: qty 4

## 2016-05-13 MED ORDER — LACTATED RINGERS IV SOLN: 500.0000 mL | Freq: Once | INTRAVENOUS | Status: DC

## 2016-05-13 MED ORDER — OXYCODONE-ACETAMINOPHEN 5-325 MG PO TABS: 1.0000 | ORAL_TABLET | ORAL | Status: DC | PRN

## 2016-05-13 MED ORDER — DIPHENHYDRAMINE HCL 25 MG PO CAPS: 25.0000 mg | ORAL_CAPSULE | Freq: Four times a day (QID) | ORAL | Status: DC | PRN

## 2016-05-13 MED ORDER — ZOLPIDEM TARTRATE 5 MG PO TABS: 5.0000 mg | ORAL_TABLET | Freq: Every evening | ORAL | Status: DC | PRN

## 2016-05-13 MED ORDER — LACTATED RINGERS IV SOLN: INTRAVENOUS | Status: DC

## 2016-05-13 MED ORDER — FLEET ENEMA 7-19 GM/118ML RE ENEM: 1.0000 | ENEMA | RECTAL | Status: DC | PRN

## 2016-05-13 MED ORDER — OXYTOCIN 40 UNITS IN LACTATED RINGERS INFUSION - SIMPLE MED
2.5000 [IU]/h | INTRAVENOUS | Status: DC
  Administered 2016-05-13: 2.5 [IU]/h via INTRAVENOUS
  Filled 2016-05-13: qty 1000

## 2016-05-13 MED ORDER — PRENATAL MULTIVITAMIN CH
1.0000 | ORAL_TABLET | Freq: Every day | ORAL | Status: DC
  Administered 2016-05-14 – 2016-05-15 (×2): 1 via ORAL
  Filled 2016-05-13 (×2): qty 1

## 2016-05-13 MED ORDER — ACETAMINOPHEN 325 MG PO TABS
650.0000 mg | ORAL_TABLET | ORAL | Status: DC | PRN
  Administered 2016-05-14 (×2): 650 mg via ORAL
  Filled 2016-05-13 (×2): qty 2

## 2016-05-13 MED ORDER — FENTANYL 2.5 MCG/ML BUPIVACAINE 1/10 % EPIDURAL INFUSION (WH - ANES)
14.0000 mL/h | INTRAMUSCULAR | Status: DC | PRN
  Administered 2016-05-13: 14 mL/h via EPIDURAL
  Filled 2016-05-13: qty 100

## 2016-05-13 MED ORDER — PHENYLEPHRINE 40 MCG/ML (10ML) SYRINGE FOR IV PUSH (FOR BLOOD PRESSURE SUPPORT)
80.0000 ug | PREFILLED_SYRINGE | INTRAVENOUS | Status: DC | PRN
  Filled 2016-05-13: qty 5
  Filled 2016-05-13: qty 10

## 2016-05-13 MED ORDER — WITCH HAZEL-GLYCERIN EX PADS: 1.0000 "application " | MEDICATED_PAD | CUTANEOUS | Status: DC | PRN

## 2016-05-13 MED ORDER — DIPHENHYDRAMINE HCL 50 MG/ML IJ SOLN: 12.5000 mg | INTRAMUSCULAR | Status: DC | PRN

## 2016-05-13 MED ORDER — LACTATED RINGERS IV SOLN
500.0000 mL | INTRAVENOUS | Status: DC | PRN
Start: 2016-05-13 — End: 2016-05-13

## 2016-05-13 MED ORDER — ONDANSETRON HCL 4 MG/2ML IJ SOLN: 4.0000 mg | INTRAMUSCULAR | Status: DC | PRN

## 2016-05-13 MED ORDER — DIBUCAINE 1 % RE OINT: 1.0000 "application " | TOPICAL_OINTMENT | RECTAL | Status: DC | PRN

## 2016-05-13 MED ORDER — LIDOCAINE HCL (PF) 1 % IJ SOLN
INTRAMUSCULAR | Status: DC | PRN
  Administered 2016-05-13 (×2): 6 mL via EPIDURAL

## 2016-05-13 MED ORDER — LIDOCAINE HCL (PF) 1 % IJ SOLN
30.0000 mL | INTRAMUSCULAR | Status: DC | PRN
  Filled 2016-05-13: qty 30

## 2016-05-13 MED ORDER — ONDANSETRON HCL 4 MG PO TABS: 4.0000 mg | ORAL_TABLET | ORAL | Status: DC | PRN

## 2016-05-13 MED ORDER — ONDANSETRON HCL 4 MG/2ML IJ SOLN: 4.0000 mg | Freq: Four times a day (QID) | INTRAMUSCULAR | Status: DC | PRN

## 2016-05-13 MED ORDER — PHENYLEPHRINE 40 MCG/ML (10ML) SYRINGE FOR IV PUSH (FOR BLOOD PRESSURE SUPPORT)
80.0000 ug | PREFILLED_SYRINGE | INTRAVENOUS | Status: DC | PRN
  Filled 2016-05-13: qty 5

## 2016-05-13 MED ORDER — TETANUS-DIPHTH-ACELL PERTUSSIS 5-2.5-18.5 LF-MCG/0.5 IM SUSP: 0.5000 mL | Freq: Once | INTRAMUSCULAR | Status: DC

## 2016-05-13 MED ORDER — ACETAMINOPHEN 325 MG PO TABS: 650.0000 mg | ORAL_TABLET | ORAL | Status: DC | PRN

## 2016-05-13 MED ORDER — FERROUS SULFATE 325 (65 FE) MG PO TABS
325.0000 mg | ORAL_TABLET | Freq: Two times a day (BID) | ORAL | Status: DC
  Administered 2016-05-14 – 2016-05-15 (×3): 325 mg via ORAL
  Filled 2016-05-13 (×3): qty 1

## 2016-05-13 MED ORDER — COCONUT OIL OIL: 1.0000 "application " | TOPICAL_OIL | Status: DC | PRN

## 2016-05-13 MED ORDER — MAGNESIUM HYDROXIDE 400 MG/5ML PO SUSP: 30.0000 mL | ORAL | Status: DC | PRN

## 2016-05-13 MED ORDER — SIMETHICONE 80 MG PO CHEW: 80.0000 mg | CHEWABLE_TABLET | ORAL | Status: DC | PRN

## 2016-05-13 MED ORDER — MEASLES, MUMPS & RUBELLA VAC ~~LOC~~ INJ: 0.5000 mL | INJECTION | Freq: Once | SUBCUTANEOUS | Status: DC

## 2016-05-13 NOTE — Anesthesia Preprocedure Evaluation (Signed)
Anesthesia Evaluation  Patient identified by MRN, date of birth, ID band Patient awake    Reviewed: Allergy & Precautions, H&P , NPO status , Patient's Chart, lab work & pertinent test results  Airway Mallampati: III  TM Distance: >3 FB Neck ROM: full    Dental no notable dental hx. (+) Teeth Intact   Pulmonary neg pulmonary ROS,    Pulmonary exam normal    (-) rales    Cardiovascular Hypertension: PIH, low plts noted. Normal cardiovascular exam     Neuro/Psych negative neurological ROS  negative psych ROS   GI/Hepatic negative GI ROS, Neg liver ROS,   Endo/Other  negative endocrine ROS  Renal/GU negative Renal ROS     Musculoskeletal negative musculoskeletal ROS (+)   Abdominal (+) + obese,   Peds  Hematology negative hematology ROS (+)   Anesthesia Other Findings     Reproductive/Obstetrics (+) Pregnancy                             Anesthesia Physical  Anesthesia Plan  ASA: III  Anesthesia Plan: Epidural   Post-op Pain Management:    Induction:   Airway Management Planned:   Additional Equipment:   Intra-op Plan:   Post-operative Plan:   Informed Consent: I have reviewed the patients History and Physical, chart, labs and discussed the procedure including the risks, benefits and alternatives for the proposed anesthesia with the patient or authorized representative who has indicated his/her understanding and acceptance.   Dental Advisory Given  Plan Discussed with:   Anesthesia Plan Comments: ( )        Anesthesia Quick Evaluation

## 2016-05-13 NOTE — Progress Notes (Signed)
NSVD of viable female infant over intact perineum. See delivery summary

## 2016-05-13 NOTE — Anesthesia Postprocedure Evaluation (Signed)
Anesthesia Post Note  Patient: Sandy Morton  Procedure(s) Performed: * No procedures listed *  Patient location during evaluation: Mother Baby Anesthesia Type: Epidural Level of consciousness: awake and alert Pain management: satisfactory to patient Vital Signs Assessment: post-procedure vital signs reviewed and stable Respiratory status: respiratory function stable Cardiovascular status: stable Postop Assessment: no headache, no backache, epidural receding, patient able to bend at knees, no signs of nausea or vomiting and adequate PO intake Anesthetic complications: no        Last Vitals:  Vitals:   05/13/16 1945 05/13/16 2003  BP: 119/66 133/83  Pulse: 87 81  Resp: 20 20  Temp: 36.9 C     Last Pain:  Vitals:   05/13/16 2045  TempSrc:   PainSc: 3    Pain Goal:                 Chrislyn Seedorf

## 2016-05-13 NOTE — Anesthesia Procedure Notes (Signed)
Epidural Patient location during procedure: OB Start time: 05/13/2016 1:49 PM End time: 05/13/2016 1:51 PM  Staffing Performed: anesthesiologist   Preanesthetic Checklist Completed: patient identified, surgical consent, pre-op evaluation, timeout performed, IV checked, risks and benefits discussed and monitors and equipment checked  Epidural Patient position: sitting Prep: site prepped and draped and DuraPrep Patient monitoring: continuous pulse ox and blood pressure Approach: midline Location: L3-L4 Injection technique: LOR air  Needle:  Needle type: Tuohy  Needle gauge: 17 G Needle length: 9 cm and 9 Needle insertion depth: 5 cm cm Catheter type: closed end flexible Catheter size: 19 Gauge Catheter at skin depth: 10 cm Test dose: negative and Other  Assessment Sensory level: T9 Events: blood not aspirated, injection not painful, no injection resistance, negative IV test and no paresthesia

## 2016-05-13 NOTE — H&P (Signed)
LABOR AND DELIVERY ADMISSION HISTORY AND PHYSICAL NOTE  Sandy Morton is a 21 y.o. female 153P2002 with IUP at 7652w2d by 12 week US presenting for regular contractions since 5-6 am today.  Pt reports 9/10 lower abdominal pain during her contractions.  She was seen in the MAU last night due to irregular contractions and some bloody mucous around 10 pm but was discharged home after cervical check showing 1.5/50/-3.  In the MAU today was 4-4.5cm dilated.  She denies vaginal bleeding, leakage of fluid, headache, vision changes, RUQ abdominal pain, or edema.  She reports positive fetal movement.  Prenatal History/Complications: Previous pregnancy complicated by preeclampsia.  No complications with current pregnancy.  Past Medical History: Past Medical History:  Diagnosis Date  . No pertinent past medical history   . Pregnant state, incidental   . Supervision of normal pregnancy 09/04/2013    Past Surgical History: Past Surgical History:  Procedure Laterality Date  . NO PAST SURGERIES      Obstetrical History: OB History    Gravida Para Term Preterm AB Living   3 2 2  0 0 2   SAB TAB Ectopic Multiple Live Births   0 0 0 0 2      Social History: Social History   Social History  . Marital status: Married    Spouse name: N/A  . Number of children: N/A  . Years of education: N/A   Social History Main Topics  . Smoking status: Never Smoker  . Smokeless tobacco: Never Used  . Alcohol use No  . Drug use: No  . Sexual activity: Yes    Birth control/ protection: None   Other Topics Concern  . None   Social History Narrative  . None    Family History: History reviewed. No pertinent family history.  Allergies: No Known Allergies  Prescriptions Prior to Admission  Medication Sig Dispense Refill Last Dose  . aspirin 81 MG tablet Take 1 tablet (81 mg total) by mouth daily. (Patient not taking: Reported on 04/26/2016) 30 tablet 4 Not Taking  . ibuprofen (ADVIL,MOTRIN) 600 MG  tablet Take 1 tablet (600 mg total) by mouth every 6 (six) hours. (Patient not taking: Reported on 04/26/2016) 30 tablet 0 Not Taking  . Prenatal Multivit-Min-Fe-FA (PRENATAL VITAMINS PO) Take 1 tablet by mouth daily.   Not Taking  . Prenatal Vit-Fe Fumarate-FA (PRENATAL VITAMIN) 27-0.8 MG TABS Take 1 tablet by mouth daily. (Patient not taking: Reported on 04/26/2016) 30 tablet 6 Not Taking     Review of Systems   All systems reviewed and negative except as stated in HPI  Blood pressure 137/83, pulse 100, temperature 99.5 F (37.5 C), temperature source Oral, resp. rate 19, height 5' (1.524 m), weight 88.5 kg (195 lb), SpO2 100 %, unknown if currently breastfeeding. General appearance: alert, cooperative, appears stated age and mild distress Lungs: clear to auscultation bilaterally Heart: regular rate and rhythm Abdomen: soft, non-tender; bowel sounds normal Extremities: No calf swelling or tenderness Presentation: cephalic, vertex by cervical exam today Fetal monitoring: baseline 140s bpm, mod variability, +accels, no decels Uterine activity: contractions q82mins. Dilation: 4.5 Effacement (%): 90 Station: -2 Exam by:: [employer]. RN   Prenatal labs: ABO, Rh: --/--/O POS (02/08 1241) Antibody: PENDING (02/08 1241) Rubella: immune (11/03/15) RPR: NON REAC (11/20 0931)  HBsAg: NEGATIVE (07/31 1027)  HIV: NONREACTIVE (11/20 0931)  GBS: Negative (01/08 0000)  1 hr Glucola: 190 (02/23/16) Genetic screening:  Quad screen negative Anatomy US: nml, posterior placenta, female  Prenatal  Transfer Tool  Maternal Diabetes: No Genetic Screening: Normal Maternal Ultrasounds/Referrals: Normal Fetal Ultrasounds or other Referrals:  None Maternal Substance Abuse:  No Significant Maternal Medications:  None Significant Maternal Lab Results: None  Results for orders placed or performed during the hospital encounter of 05/13/16 (from the past 24 hour(s))  CBC   Collection Time: 05/13/16 12:41 PM   Result Value Ref Range   WBC 7.4 4.0 - 10.5 K/uL   RBC 4.26 3.87 - 5.11 MIL/uL   Hemoglobin 13.3 12.0 - 15.0 g/dL   HCT 47.4 25.9 - 56.3 %   MCV 91.1 78.0 - 100.0 fL   MCH 31.2 26.0 - 34.0 pg   MCHC 34.3 30.0 - 36.0 g/dL   RDW 87.5 64.3 - 32.9 %   Platelets PENDING 150 - 400 K/uL  Type and screen [facility]   Collection Time: 05/13/16 12:41 PM  Result Value Ref Range   ABO/RH(D) O POS    Antibody Screen PENDING    Sample Expiration 05/16/2016     Patient Active Problem List   Diagnosis Date Noted  . Pregnancy 05/13/2016  . Supervision of normal pregnancy, antepartum 12/01/2015  . Hx of severe preeclampsia, prior pregnancy, currently pregnant 09/25/2013  . Hx of maternal blood transfusion, currently pregnant 09/25/2013    Assessment: Sandy Morton is a 21 y.o. G3P2002 at [redacted]w[redacted]d here for regular contractions and cervical exam consistent with late latent phase / early active phase of labor.  #labor: SOL, contracting regularly q85min,  anticipating SVD #Pain: Epidural planned #FWB: Cat I #ID: GBS neg #MOF: breast #MOC: mirena #Circ:  n/a  Daniel L. Myrtie Soman, MD PGY-1 05/13/2016 2:07 PM   The patient was seen and examined by me also Agree with note NST reactive and reassuring UCs as listed Cervical exams as listed in note  Aviva Signs, CNM

## 2016-05-13 NOTE — MAU Note (Signed)
Pt presents with complaint of contractions.  

## 2016-05-13 NOTE — Progress Notes (Signed)
May D/C home 

## 2016-05-13 NOTE — Progress Notes (Signed)
Urine sent to lab 

## 2016-05-13 NOTE — Anesthesia Procedure Notes (Signed)
Epidural Patient location during procedure: OB Start time: 05/13/2016 1:49 PM End time: 05/13/2016 1:51 PM  Staffing Anesthesiologist: Leilani AbleHATCHETT, Tanay Massiah Performed: anesthesiologist   Preanesthetic Checklist Completed: patient identified, surgical consent, pre-op evaluation, timeout performed, IV checked, risks and benefits discussed and monitors and equipment checked  Epidural Patient position: sitting Prep: site prepped and draped and DuraPrep Patient monitoring: continuous pulse ox and blood pressure Approach: midline Location: L3-L4 Injection technique: LOR air  Needle:  Needle type: Tuohy  Needle gauge: 17 G Needle length: 9 cm and 9 Needle insertion depth: 5 cm cm Catheter type: closed end flexible Catheter size: 19 Gauge Catheter at skin depth: 10 cm Test dose: negative and Other  Assessment Sensory level: T9 Events: blood not aspirated, injection not painful, no injection resistance, negative IV test and no paresthesia  Additional Notes Reason for block:procedure for pain

## 2016-05-14 LAB — CBC
HCT: 34.4 % — ABNORMAL LOW (ref 36.0–46.0)
Hemoglobin: 11.8 g/dL — ABNORMAL LOW (ref 12.0–15.0)
MCH: 31.1 pg (ref 26.0–34.0)
MCHC: 34.3 g/dL (ref 30.0–36.0)
MCV: 90.8 fL (ref 78.0–100.0)
PLATELETS: 106 10*3/uL — AB (ref 150–400)
RBC: 3.79 MIL/uL — ABNORMAL LOW (ref 3.87–5.11)
RDW: 13.9 % (ref 11.5–15.5)
WBC: 9.4 10*3/uL (ref 4.0–10.5)

## 2016-05-14 LAB — RPR: RPR: NONREACTIVE

## 2016-05-14 NOTE — Progress Notes (Signed)
POSTPARTUM PROGRESS NOTE  Post Partum Day #1  Subjective:  Sandy Morton is a 21 y.o. [id number] 2971w2d s/p SVD.  No acute events overnight.  Pt denies problems with ambulating, voiding or po intake.  She denies nausea or vomiting.  Pain is well controlled.  She has had flatus. She has not had bowel movement.  Lochia Minimal.   Objective: Blood pressure 117/80, pulse 80, temperature 98.2 F (36.8 C), temperature source Oral, resp. rate 16, height 5' (1.524 m), weight 88.5 kg (195 lb), SpO2 99 %, unknown if currently breastfeeding.  Physical Exam:  General: alert, cooperative and no distress Lochia:normal flow Chest: CTAB Heart: RRR no m/r/g Abdomen: +BS, soft, nontender,  Uterine Fundus: firm DVT Evaluation: No calf swelling or tenderness Extremities: no edema   Recent Labs  05/13/16 1840 05/14/16 0555  HGB 12.7 11.8*  HCT 37.0 34.4*    Assessment/Plan:  ASSESSMENT: Sandy Morton is a 21 y.o. G3P3003 8071w2d s/p SVD. Mom feeling well and has no concerns.   Plan for discharge tomorrow   LOS: 1 day   Renne Muscaaniel L Warden, MD PGY-1 [facility], [facility]  05/14/2016, 7:13 AM    I examined pt and agree with documentation above and resident plan of care. Cephus ShellingWalidah Kairm, CNM

## 2016-05-14 NOTE — Progress Notes (Signed)
UR chart review completed.  

## 2016-05-15 MED ORDER — IBUPROFEN 600 MG PO TABS: 600.0000 mg | ORAL_TABLET | Freq: Four times a day (QID) | ORAL | 0 refills | Status: AC | PRN

## 2016-05-15 NOTE — Discharge Instructions (Signed)

## 2016-05-15 NOTE — Lactation Note (Signed)
This note was copied from a baby's chart. Lactation Consultation Note MOB partially breast  fed other children for a month. She [facility] any questions or concerns [facility].  Manual pump given to her with instructions and indications for use.  Flange #24 appropriate at this time. Information given on support groups and OP services.  Patient Name: Sandy Morton Today's Date: 05/15/2016 Reason for consult: Initial assessment   Maternal Data Has patient been taught Hand Expression?: Yes (per mom) Does the patient have breastfeeding experience prior to this delivery?: Yes  Feeding Feeding Type: Formula Length of feed: 33 min  LATCH Score/Interventions Latch: Grasps breast easily, tongue down, lips flanged, rhythmical sucking.  Audible Swallowing: Spontaneous and intermittent  Type of Nipple: Everted at rest and after stimulation  Comfort (Breast/Nipple): Soft / non-tender     Hold (Positioning): Assistance needed to correctly position infant at breast and maintain latch.  LATCH Score: 9  Lactation Tools Discussed/Used     Consult Status Consult Status: PRN    Soyla DryerJoseph, Sandy Morton 05/15/2016, 9:54 AM

## 2016-05-15 NOTE — Discharge Summary (Signed)
OB Discharge Summary     Patient Name: Sandy Morton DOB: 02-02-1996 MRN: [id number]  Date of admission: 05/13/2016 Delivering MD: Dorathy Kinsman   Date of discharge: 05/15/2016  Admitting diagnosis: 40WKS,LABOR Intrauterine pregnancy: [redacted]w[redacted]d     Secondary diagnosis:  Active Problems:   Pregnancy  Additional problems: hx of pre-e prev preg; thrombocytopenia noted on admission     Discharge diagnosis: Term Pregnancy Delivered                                                                                                Post partum procedures:none  Augmentation: AROM  Complications: None  Hospital course:  Onset of Labor With Vaginal Delivery     21 y.o. yo G3P3003 at [redacted]w[redacted]d was admitted in Latent Labor on 05/13/2016. Patient had an uncomplicated labor course as follows:  Membrane Rupture Time/Date: 5:39 PM ,05/13/2016   Intrapartum Procedures: Episiotomy: None [1]                                         Lacerations:  None [1]  Patient had a delivery of a Viable infant. 05/13/2016  Information for the patient's newborn:  Joelly, Bolanos [[id number]]       Pateint had an uncomplicated postpartum course.  She is ambulating, tolerating a regular diet, passing flatus, and urinating well. Patient is discharged home in stable condition on 05/15/16.   Physical exam  Vitals:   05/13/16 2200 05/14/16 0300 05/14/16 1746 05/15/16 0500  BP: 122/76 117/80 127/69 135/64  Pulse: 80 80 (!) 103 98  Resp: 18 16 18 18   Temp: 98.6 F (37 C) 98.2 F (36.8 C) 98.2 F (36.8 C) 98 F (36.7 C)  TempSrc: Oral Oral Oral Oral  SpO2: 100% 99%    Weight:      Height:       General: alert and cooperative Lochia: appropriate Uterine Fundus: firm Incision: N/A DVT Evaluation: No evidence of DVT seen on physical exam. Labs:  Plts 111 on admission; rpt 112, then last check 106 Lab Results  Component Value Date   WBC 9.4 05/14/2016   HGB 11.8 (L) 05/14/2016   HCT 34.4 (L) 05/14/2016   MCV 90.8 05/14/2016   PLT 106 (L) 05/14/2016   CMP Latest Ref Rng & Units 11/03/2015  Glucose 65 - 99 mg/dL 87  BUN 7 - 25 mg/dL 11  Creatinine 7.82 - 9.56 mg/dL 2.13  Sodium 086 - 578 mmol/L 137  Potassium 3.5 - 5.3 mmol/L 4.3  Chloride 98 - 110 mmol/L 104  CO2 20 - 31 mmol/L 26  Calcium 8.6 - 10.2 mg/dL 9.3  Total Protein 6.1 - 8.1 g/dL 6.9  Total Bilirubin 0.2 - 1.2 mg/dL 0.4  Alkaline Phos 33 - 115 U/L 51  AST 10 - 30 U/L 14  ALT 6 - 29 U/L 12    Discharge instruction: per After Visit Summary and "Baby and Me Booklet".  After visit meds:  Allergies as of 05/15/2016   No Known  Allergies     Medication List    TAKE these medications   ibuprofen 600 MG tablet Commonly known as:  ADVIL,MOTRIN Take 1 tablet (600 mg total) by mouth every 6 (six) hours as needed.       Diet: routine diet  Activity: Advance as tolerated. Pelvic rest for 6 weeks.   Outpatient follow up:6 weeks Follow up Appt:Future Appointments Date Time Provider Department Center  05/18/2016 7:00 AM [facility] None   Follow up Visit:No Follow-up on file.  Postpartum contraception: IUD Mirena  Newborn Data: Live born female  Birth Weight: 7 lb 4.2 oz (3294 g) APGAR: 8, 9  Baby Feeding: Breast Disposition:home with mother   05/15/2016 Cam HaiSHAW, Florenda Watt, CNM

## 2016-05-18 ENCOUNTER — Inpatient Hospital Stay (HOSPITAL_COMMUNITY): Admission: RE | Admit: 2016-05-18 | Payer: Medicaid Other | Source: Ambulatory Visit

## 2016-06-21 ENCOUNTER — Ambulatory Visit (INDEPENDENT_AMBULATORY_CARE_PROVIDER_SITE_OTHER): Payer: Medicaid Other | Admitting: Advanced Practice Midwife

## 2016-06-21 VITALS — BP 130/83 | HR 84 | Ht 60.0 in | Wt 174.0 lb

## 2016-06-21 DIAGNOSIS — Z30430 Encounter for insertion of intrauterine contraceptive device: Secondary | ICD-10-CM | POA: Diagnosis not present

## 2016-06-21 DIAGNOSIS — O1494 Unspecified pre-eclampsia, complicating childbirth: Secondary | ICD-10-CM | POA: Insufficient documentation

## 2016-06-21 DIAGNOSIS — Z01812 Encounter for preprocedural laboratory examination: Secondary | ICD-10-CM

## 2016-06-21 DIAGNOSIS — Z975 Presence of (intrauterine) contraceptive device: Secondary | ICD-10-CM | POA: Insufficient documentation

## 2016-06-21 LAB — POCT URINE PREGNANCY: PREG TEST UR: NEGATIVE

## 2016-06-21 MED ORDER — LEVONORGESTREL 18.6 MCG/DAY IU IUD
INTRAUTERINE_SYSTEM | Freq: Once | INTRAUTERINE | Status: AC
  Administered 2016-06-21: 11:00:00 via INTRAUTERINE

## 2016-06-21 NOTE — Patient Instructions (Signed)

## 2016-06-21 NOTE — Progress Notes (Signed)
Post Partum Exam  Sandy Morton is a 21 y.o. 333P3003 female who presents for a postpartum visit. She is 6 weeks postpartum following a spontaneous vaginal delivery. I have fully reviewed the prenatal and intrapartum course. The delivery was at 40 gestational weeks.  Anesthesia: epidural. Postpartum course has been unremarkable. Baby's course has been unremarkable. Baby is feeding by both breast and bottle - per peds. Bleeding moderate lochia. Bowel function is normal. Bladder function is normal. Patient is not sexually active. Contraception method is IUD. Postpartum depression screening:neg (score: 0)  The following portions of the patient's history were reviewed and updated as appropriate: allergies, current medications, past family history, past medical history, past social history, past surgical history and problem list.  Review of Systems Pertinent items are noted in HPI.    Objective:    BP 116/78 mmHg  Pulse 78  Resp 16  Ht 5\' 5"  (1.651 m)  Wt 211 lb (95.709 kg)  BMI 35.11 kg/m2  Breastfeeding? Yes  General:  alert, cooperative and no distress   Breasts:  inspection negative, no nipple discharge or bleeding, no masses or nodularity palpable  Lungs: clear to auscultation bilaterally  Heart:  regular rate and rhythm, S1, S2 normal, no murmur, click, rub or gallop  Abdomen: soft, non-tender; bowel sounds normal; no masses,  no organomegaly   Vulva:  normal  Vagina: normal vagina, no discharge, exudate, lesion, or erythema  Cervix:  no cervical motion tenderness  Corpus: normal size, contour, position, consistency, mobility, non-tender  Adnexa:  no mass, fullness, tenderness  Rectal Exam: Not performed.       Patient identified, informed consent performed, signed copy in chart, time out was performed.  Urine pregnancy test negative.  Speculum placed in the vagina.  Cervix visualized.  Cleaned with Betadine x 2.  Grasped anteriourly with a single tooth tenaculum.  Uterus sounded to  6cm.  Lilletta IUD placed per manufacturer's recommendations.  Strings trimmed to 3 cm.  Bleeding occurred at tenaculum site.   Silver nitrate used with pressure  Patient given post procedure instructions and Mirena care card with expiration date.  Patient is asked to check IUD strings periodically and follow up in 4-6 weeks for IUD check.  Assessment:    Normal postpartum exam. Pap smear not done at today's visit.   Plan:   1. Contraception: IUD 2. String check 1 month 3. Follow up in: 1 month or as needed.

## 2016-07-21 ENCOUNTER — Ambulatory Visit (INDEPENDENT_AMBULATORY_CARE_PROVIDER_SITE_OTHER): Payer: Medicaid Other | Admitting: Obstetrics & Gynecology

## 2016-07-21 ENCOUNTER — Encounter: Payer: Self-pay | Admitting: Obstetrics & Gynecology

## 2016-07-21 VITALS — BP 130/79 | HR 70 | Ht 60.0 in | Wt 170.0 lb

## 2016-07-21 DIAGNOSIS — Z30431 Encounter for routine checking of intrauterine contraceptive device: Secondary | ICD-10-CM | POA: Diagnosis not present

## 2016-07-21 NOTE — Progress Notes (Signed)
   Subjective:    Patient ID: Sandy Morton, female    DOB: 07/02/1995, 21 y.o.   MRN: [id number]  HPI  20 yo female presents post Mirena IUD.  Pt still has some scant bleeding when she wipes.  It does not bother her.  No pain.  No heavy bleeding.  Review of Systems  Constitutional: Negative.   Cardiovascular: Negative.   Gastrointestinal: Negative.   Genitourinary: Positive for vaginal bleeding.       Objective:   Physical Exam  Constitutional: She is oriented to person, place, and time. She appears well-developed and well-nourished. No distress.  HENT:  Head: Normocephalic and atraumatic.  Eyes: Conjunctivae are normal.  Pulmonary/Chest: Effort normal.  Abdominal: Soft. She exhibits no distension. There is no tenderness.  Genitourinary: Vagina normal and uterus normal.  Genitourinary Comments: Trace blood on glove with bimanual No blood on speculum exam Strings 2-3 cm.    Musculoskeletal: She exhibits no edema.  Neurological: She is alert and oriented to person, place, and time.  Skin: Skin is warm and dry.  Psychiatric: She has a normal mood and affect.  Vitals reviewed.  Vitals:   07/21/16 1042  BP: 130/79  Pulse: 70  Weight: 170 lb (77.1 kg)  Height: 5' (1.524 m)    Assessment & Plan:  21 yo female with IUD in place 1-Pt does not want treatment today for bleeding with IUD. 2-If bleeding continues one more month, will try 10 days provera. 3-Pt can call for provera anytime in the next month if she finds bleeding bothersome. 4-Pap due 2020 (Absent TZ but not associated with increased risk of dysplasia).

## 2016-07-26 ENCOUNTER — Encounter: Payer: Self-pay | Admitting: Advanced Practice Midwife
